# Patient Record
Sex: Female | Born: 1945 | Hispanic: Yes
Health system: Southern US, Community
[De-identification: ages and names within clinical notes are randomized; demographics above are authoritative.]

---

## 2021-04-28 ENCOUNTER — Emergency Department (HOSPITAL_COMMUNITY)
Admission: EM | Admit: 2021-04-28 | Discharge: 2021-04-28 | Disposition: A | Discharge status: Home / Self Care | Payer: Self-pay | Attending: Emergency Medicine | Admitting: Emergency Medicine

## 2021-04-28 DIAGNOSIS — Y93G3 Activity, cooking and baking: Secondary | ICD-10-CM | POA: Insufficient documentation

## 2021-04-28 DIAGNOSIS — Z23 Encounter for immunization: Secondary | ICD-10-CM | POA: Insufficient documentation

## 2021-04-28 DIAGNOSIS — X102XXA Contact with fats and cooking oils, initial encounter: Secondary | ICD-10-CM | POA: Insufficient documentation

## 2021-04-28 DIAGNOSIS — T23201A Burn of second degree of right hand, unspecified site, initial encounter: Secondary | ICD-10-CM | POA: Insufficient documentation

## 2021-04-28 DIAGNOSIS — T23251A Burn of second degree of right palm, initial encounter: Secondary | ICD-10-CM

## 2021-04-28 MED ORDER — SILVER SULFADIAZINE 1 % EX CREA
TOPICAL_CREAM | Freq: Once | CUTANEOUS | Status: AC
  Filled 2021-04-28: qty 50

## 2021-04-28 MED ORDER — ACETAMINOPHEN 500 MG PO TABS
1000.0000 mg | ORAL_TABLET | Freq: Once | ORAL | Status: AC
  Administered 2021-04-28: 1000 mg via ORAL
  Filled 2021-04-28: qty 2

## 2021-04-28 MED ORDER — TETANUS-DIPHTH-ACELL PERTUSSIS 5-2.5-18.5 LF-MCG/0.5 IM SUSY
0.5000 mL | PREFILLED_SYRINGE | Freq: Once | INTRAMUSCULAR | Status: AC
  Administered 2021-04-28: 0.5 mL via INTRAMUSCULAR
  Filled 2021-04-28: qty 0.5

## 2021-04-28 MED ORDER — ACETAMINOPHEN 500 MG PO TABS: 1000.0000 mg | ORAL_TABLET | Freq: Four times a day (QID) | ORAL | 0 refills | Status: AC | PRN

## 2021-04-28 NOTE — Discharge Instructions (Addendum)
You have suffered a burn wound to your right hand.  Please apply Silvadene cream over the wound twice daily for the next 7 days to decrease risk of infection.  Take Tylenol as needed for pain.  Do not pop the blister, allow for it to shrink over time.  Return if you have any concern.  Ha sufrido una quemadura en la mano derecha. Aplique la crema Silvadene sobre la herida dos veces al da durante los prximos 7 das para disminuir el riesgo de infeccin. Tome Tylenol segn sea necesario para Chief Technology Officer. No reviente la ampolla, deje que se encoja con el tiempo. Regrese si tiene alguna inquietud.

## 2021-04-28 NOTE — ED Provider Notes (Signed)
[facility]-EMERGENCY DEPT Provider Note   CSN: [id number] Arrival date & time: 04/28/21  1825     History No chief complaint on file.   Christina Oconnell is a 75 y.o. female.  The history is provided by the patient and a relative. The history is limited by a language barrier. No language interpreter was used.     75 year old female accompanied by son to the ER for evaluation of a burn wound.  Patient is Hispanic, there is a language barrier.  Patient was cooking 4 days ago and accidentally burned her right dominant hand on cooking grease.  Since then it has been swollen and tender oozing of fluid.  Unsure last tetanus status.  She is here for further management.  No report of fever and no report of numbness.  No past medical history on file.  There are no problems to display for this patient.    The histories are not reviewed yet. Please review them in the "History" navigator section and refresh this SmartLink.   OB History   No obstetric history on file.     No family history on file.     Home Medications Prior to Admission medications   Not on File    Allergies    Patient has no allergy information on record.  Review of Systems   Review of Systems  Constitutional: Negative for fever.  Skin: Positive for wound.    Physical Exam Updated Vital Signs BP (!) 173/75   Pulse 66   Temp 99 F (37.2 C) (Oral)   Resp 14   Ht 5\' 2"  (1.575 m)   Wt 72.2 kg   SpO2 100%   BMI 29.12 kg/m   Physical Exam Vitals and nursing note reviewed.  Constitutional:      General: She is not in acute distress.    Appearance: She is well-developed.  HENT:     Head: Atraumatic.  Eyes:     Conjunctiva/sclera: Conjunctivae normal.  Musculoskeletal:        General: Signs of injury (Right hand: Burn mark noted to the thenar eminence with blister formation, sensation intact, no significant erythema.  Area tender to palpation.) present.     Cervical  back: Neck supple.  Skin:    Findings: No rash.  Neurological:     Mental Status: She is alert.     ED Results / Procedures / Treatments   Labs (all labs ordered are listed, but only abnormal results are displayed) Labs Reviewed - No data to display  EKG None  Radiology No results found.  Procedures Procedures   Medications Ordered in ED Medications  Tdap (BOOSTRIX) injection 0.5 mL (has no administration in time range)  silver sulfADIAZINE (SILVADENE) 1 % cream (has no administration in time range)  acetaminophen (TYLENOL) tablet 1,000 mg (has no administration in time range)    ED Course  I have reviewed the triage vital signs and the nursing notes.  Pertinent labs & imaging results that were available during my care of the patient were reviewed by me and considered in my medical decision making (see chart for details).    MDM Rules/Calculators/A&P                          BP (!) 173/75   Pulse 66   Temp 99 F (37.2 C) (Oral)   Resp 14   Ht 5\' 2"  (1.575 m)   Wt 72.2 kg  SpO2 100%   BMI 29.12 kg/m   Final Clinical Impression(s) / ED Diagnoses Final diagnoses:  Partial thickness burn of palm of right hand, initial encounter    Rx / DC Orders ED Discharge Orders         Ordered    acetaminophen (TYLENOL) 500 MG tablet  Every 6 hours PRN        04/28/21 1920         7:19 PM Patient suffered a burn wound consistent with a partial-thickness burn to her right dominant hand.  Burn wound is overlying the thenar eminence.  It is noncircumferential.  Sensation is intact distally, no obvious signs of infection.  It is a blister at this time.  We will apply Silvadene, give Tylenol for pain, and wound care provided.  Return precaution provided.  Will update tetanus.   Fayrene Helper, PA-C 04/28/21 Jarome Matin, MD 04/30/21 [phone]

## 2021-04-28 NOTE — ED Triage Notes (Signed)
Pt c/o burn to right hand, blister noted. Unknown last tetanus

## 2021-04-28 NOTE — ED Notes (Signed)
Cream applied to burn and dressed with nonadherant gauze and gauze roll. Pt and family verbalized understanding of discharge instructions and wound care at home.

## 2021-05-31 ENCOUNTER — Observation Stay (HOSPITAL_COMMUNITY): Payer: Self-pay

## 2021-05-31 ENCOUNTER — Emergency Department (HOSPITAL_COMMUNITY): Payer: Self-pay

## 2021-05-31 ENCOUNTER — Observation Stay (HOSPITAL_COMMUNITY)
Admission: EM | Admit: 2021-05-31 | Discharge: 2021-06-02 | Disposition: A | Discharge status: Home / Self Care | Payer: Self-pay | Attending: Internal Medicine | Admitting: Internal Medicine

## 2021-05-31 DIAGNOSIS — R569 Unspecified convulsions: Principal | ICD-10-CM | POA: Insufficient documentation

## 2021-05-31 DIAGNOSIS — Z20822 Contact with and (suspected) exposure to covid-19: Secondary | ICD-10-CM | POA: Insufficient documentation

## 2021-05-31 DIAGNOSIS — R55 Syncope and collapse: Secondary | ICD-10-CM | POA: Insufficient documentation

## 2021-05-31 DIAGNOSIS — G40909 Epilepsy, unspecified, not intractable, without status epilepticus: Secondary | ICD-10-CM

## 2021-05-31 DIAGNOSIS — R930 Abnormal findings on diagnostic imaging of skull and head, not elsewhere classified: Secondary | ICD-10-CM | POA: Insufficient documentation

## 2021-05-31 DIAGNOSIS — R778 Other specified abnormalities of plasma proteins: Secondary | ICD-10-CM | POA: Insufficient documentation

## 2021-05-31 DIAGNOSIS — I10 Essential (primary) hypertension: Secondary | ICD-10-CM | POA: Insufficient documentation

## 2021-05-31 DIAGNOSIS — Z79899 Other long term (current) drug therapy: Secondary | ICD-10-CM | POA: Insufficient documentation

## 2021-05-31 DIAGNOSIS — M25511 Pain in right shoulder: Secondary | ICD-10-CM | POA: Insufficient documentation

## 2021-05-31 LAB — CBC WITH DIFFERENTIAL/PLATELET
Abs Immature Granulocytes: 0.06 10*3/uL (ref 0.00–0.07)
Basophils Absolute: 0 10*3/uL (ref 0.0–0.1)
Basophils Relative: 1 %
Eosinophils Absolute: 0.1 10*3/uL (ref 0.0–0.5)
Eosinophils Relative: 1 %
HCT: 37.8 % (ref 36.0–46.0)
Hemoglobin: 12 g/dL (ref 12.0–15.0)
Immature Granulocytes: 1 %
Lymphocytes Relative: 28 %
Lymphs Abs: 2.4 10*3/uL (ref 0.7–4.0)
MCH: 26.9 pg (ref 26.0–34.0)
MCHC: 31.7 g/dL (ref 30.0–36.0)
MCV: 84.8 fL (ref 80.0–100.0)
Monocytes Absolute: 0.5 10*3/uL (ref 0.1–1.0)
Monocytes Relative: 6 %
Neutro Abs: 5.3 10*3/uL (ref 1.7–7.7)
Neutrophils Relative %: 63 %
Platelets: 311 10*3/uL (ref 150–400)
RBC: 4.46 MIL/uL (ref 3.87–5.11)
RDW: 14.2 % (ref 11.5–15.5)
WBC: 8.3 10*3/uL (ref 4.0–10.5)
nRBC: 0 % (ref 0.0–0.2)

## 2021-05-31 LAB — COMPREHENSIVE METABOLIC PANEL
ALT: 20 U/L (ref 0–44)
AST: 29 U/L (ref 15–41)
Albumin: 3.7 g/dL (ref 3.5–5.0)
Alkaline Phosphatase: 82 U/L (ref 38–126)
Anion gap: 10 (ref 5–15)
BUN: 17 mg/dL (ref 8–23)
CO2: 23 mmol/L (ref 22–32)
Calcium: 8.8 mg/dL — ABNORMAL LOW (ref 8.9–10.3)
Chloride: 102 mmol/L (ref 98–111)
Creatinine, Ser: 0.71 mg/dL (ref 0.44–1.00)
GFR, Estimated: >60 mL/min (ref >60)
Glucose, Bld: 140 mg/dL — ABNORMAL HIGH (ref 70–99)
Potassium: 4 mmol/L (ref 3.5–5.1)
Sodium: 135 mmol/L (ref 135–145)
Total Bilirubin: 0.5 mg/dL (ref 0.3–1.2)
Total Protein: 6.8 g/dL (ref 6.5–8.1)

## 2021-05-31 LAB — TROPONIN I (HIGH SENSITIVITY)
Troponin I (High Sensitivity): 8 ng/L (ref <18)
Troponin I (High Sensitivity): 22 ng/L — ABNORMAL HIGH (ref <18)

## 2021-05-31 LAB — URINALYSIS, ROUTINE W REFLEX MICROSCOPIC
Bacteria, UA: NONE SEEN
Bilirubin Urine: NEGATIVE
Glucose, UA: NEGATIVE mg/dL
Ketones, ur: NEGATIVE mg/dL
Leukocytes,Ua: NEGATIVE
Nitrite: NEGATIVE
Protein, ur: NEGATIVE mg/dL
Specific Gravity, Urine: 1.008 (ref 1.005–1.030)
pH: 7 (ref 5.0–8.0)

## 2021-05-31 LAB — PROTIME-INR
INR: 1 (ref 0.8–1.2)
Prothrombin Time: 13.6 seconds (ref 11.4–15.2)

## 2021-05-31 LAB — RESP PANEL BY RT-PCR (FLU A&B, COVID) ARPGX2
Influenza A by PCR: NEGATIVE
Influenza B by PCR: NEGATIVE
SARS Coronavirus 2 by RT PCR: NEGATIVE

## 2021-05-31 LAB — CBG MONITORING, ED: Glucose-Capillary: 117 mg/dL — ABNORMAL HIGH (ref 70–99)

## 2021-05-31 MED ORDER — HYDRALAZINE HCL 20 MG/ML IJ SOLN: 5.0000 mg | Freq: Four times a day (QID) | INTRAMUSCULAR | Status: DC | PRN

## 2021-05-31 MED ORDER — VITAMIN D 25 MCG (1000 UNIT) PO TABS
1000.0000 [IU] | ORAL_TABLET | Freq: Every day | ORAL | Status: DC
  Administered 2021-06-01 – 2021-06-02 (×2): 1000 [IU] via ORAL
  Filled 2021-05-31 (×2): qty 1

## 2021-05-31 MED ORDER — MELATONIN 3 MG PO TABS: 3.0000 mg | ORAL_TABLET | Freq: Every evening | ORAL | Status: DC | PRN

## 2021-05-31 MED ORDER — LORAZEPAM 2 MG/ML IJ SOLN: 2.0000 mg | Freq: Four times a day (QID) | INTRAMUSCULAR | Status: DC | PRN

## 2021-05-31 MED ORDER — OMEGA-3-ACID ETHYL ESTERS 1 G PO CAPS
1.0000 g | ORAL_CAPSULE | Freq: Two times a day (BID) | ORAL | Status: DC
  Administered 2021-05-31 – 2021-06-02 (×4): 1 g via ORAL
  Filled 2021-05-31 (×4): qty 1

## 2021-05-31 MED ORDER — VITAMIN B-12 1000 MCG PO TABS
500.0000 ug | ORAL_TABLET | Freq: Every day | ORAL | Status: DC
  Administered 2021-05-31 – 2021-06-02 (×3): 500 ug via ORAL
  Filled 2021-05-31 (×3): qty 1

## 2021-05-31 MED ORDER — POLYETHYLENE GLYCOL 3350 17 G PO PACK: 17.0000 g | PACK | Freq: Every day | ORAL | Status: DC | PRN

## 2021-05-31 MED ORDER — CARBAMAZEPINE 200 MG PO TABS
200.0000 mg | ORAL_TABLET | Freq: Once | ORAL | Status: AC
  Administered 2021-05-31: 200 mg via ORAL
  Filled 2021-05-31: qty 1

## 2021-05-31 MED ORDER — GADOBUTROL 1 MMOL/ML IV SOLN
6.5000 mL | Freq: Once | INTRAVENOUS | Status: AC | PRN
  Administered 2021-05-31: 6.5 mL via INTRAVENOUS

## 2021-05-31 MED ORDER — OXYCODONE HCL 5 MG PO TABS
5.0000 mg | ORAL_TABLET | Freq: Four times a day (QID) | ORAL | Status: DC | PRN
Start: 2021-05-31 — End: 2021-06-02
  Administered 2021-05-31: 5 mg via ORAL
  Filled 2021-05-31: qty 1

## 2021-05-31 MED ORDER — ACETAMINOPHEN 325 MG PO TABS
650.0000 mg | ORAL_TABLET | Freq: Four times a day (QID) | ORAL | Status: DC | PRN
  Administered 2021-06-01: 650 mg via ORAL
  Filled 2021-05-31: qty 2

## 2021-05-31 MED ORDER — ONDANSETRON HCL 4 MG/2ML IJ SOLN: 4.0000 mg | Freq: Four times a day (QID) | INTRAMUSCULAR | Status: DC | PRN

## 2021-05-31 MED ORDER — CARBAMAZEPINE 200 MG PO TABS
200.0000 mg | ORAL_TABLET | Freq: Two times a day (BID) | ORAL | Status: DC
  Administered 2021-05-31 – 2021-06-02 (×4): 200 mg via ORAL
  Filled 2021-05-31 (×5): qty 1

## 2021-05-31 MED ORDER — ENOXAPARIN SODIUM 40 MG/0.4ML IJ SOSY
40.0000 mg | PREFILLED_SYRINGE | INTRAMUSCULAR | Status: DC
  Administered 2021-05-31 – 2021-06-01 (×2): 40 mg via SUBCUTANEOUS
  Filled 2021-05-31 (×2): qty 0.4

## 2021-05-31 MED ORDER — LACTATED RINGERS IV SOLN: INTRAVENOUS | Status: AC

## 2021-05-31 MED ORDER — ENALAPRIL MALEATE 5 MG PO TABS
20.0000 mg | ORAL_TABLET | Freq: Every day | ORAL | Status: DC
  Administered 2021-05-31 – 2021-06-02 (×3): 20 mg via ORAL
  Filled 2021-05-31 (×3): qty 4
  Filled 2021-05-31: qty 1

## 2021-05-31 NOTE — ED Triage Notes (Addendum)
Pt to ED via EMS from [place] C/O seizure like activity witnessed by family. lasting aprox 5 mins..Incontinent episode after,  No memory of event. #20 LFA; No medications given by EMS. Limited history /report . Pt takes tegretol, been off 3 days. Medical hx: HTN . Last VS: 160/92, CNG 130, hr 90, 98%RA. RR 18

## 2021-05-31 NOTE — H&P (Addendum)
History and Physical  Christina Oconnell TKW:[id number] DOB: 16-Jan-1946 DOA: 05/31/2021  Referring physician: Dr. Rodena Medin, EDP. PCP: Pcp, No  Outpatient Specialists: Neurology in [place] Patient coming from: Home, visiting family in [place] from [place].    Chief Complaint: Passed out while at [employer] during her meal.  HPI: Christina Oconnell is a 75 y.o. female with medical history significant for essential hypertension, absence seizure disorder who presented to [facility] ED via EMS after passing out while at [employer] [employer] [facility] during her meal.  Patient ran out of her carbamazepine 200 mg twice daily, 4 days ago.  She is visiting family in [place] from [place].  During the meal at [employer] she collapsed falling to her right side.  She became unresponsive for approximately 5 minutes.  She was in her usual state of health prior to this.  This event was associated with urine incontinence.  However her absence seizure has not previously presented with syncope.  History is mainly obtained from her son at bedside.  She was brought to the ED for further evaluation.  CT head in the ED revealed asymmetric hypodensity left cerebellum with radiology recommendation for brain MRI with and without contrast.  [facility], hospitalist team, was asked to admit.    She was seen and examined with her son at her bedside.  Not postictal at the time of this visit.  Interview completed using translator Donata Clay [phone].  Patient reports no prior history of syncope.  No dizziness, lightheadedness, palpitations, chest pain or dyspnea prior to passing out.  No history of use of alcohol.  No family history of seizures.  Patient personally requests neurology consultation.  Dr. Thomasena Edis, neurology, consulted.  ED Course:  Temperature 98.2.  BP 145/80, pulse 72, respiration rate 16, O2 saturation 97% on room air.  Lab studies remarkable for serum sodium 135, potassium 4.0, serum bicarb 23, glucose 140, BUN 17, creatinine 0.71,  anion gap 10, GFR greater than 60, troponin 8, 22.  CBC with differential essentially unremarkable.  Urine analysis and COVID-19 screening test are pending.  Review of Systems: Review of systems as noted in the HPI. All other systems reviewed and are negative.  Past medical history: Seizure disorder, absence seizure. Essential hypertension  Social History:  has no history on file for tobacco use, alcohol use, and drug use.   No Known Allergies  Family history: No family history of CVA. Brother with history of hypertension.  Prior to Admission medications   Medication Sig Start Date End Date Taking? Authorizing Provider  acetaminophen (TYLENOL) 500 MG tablet Take 2 tablets (1,000 mg total) by mouth every 6 (six) hours as needed for moderate pain. 04/28/21  Yes Fayrene Helper, PA-C  carbamazepine (TEGRETOL) 200 MG tablet Take 200 mg by mouth in the morning and at bedtime.   Yes [provider]  cholecalciferol (VITAMIN D3) 25 MCG (1000 UNIT) tablet Take 1,000 Units by mouth daily.   Yes [provider]  Cyanocobalamin (VITAMIN B 12 PO) Take 1 tablet by mouth daily.   Yes [provider]  enalapril (VASOTEC) 20 MG tablet Take 20 mg by mouth daily.   Yes [provider]  omega-3 acid ethyl esters (LOVAZA) 1 g capsule Take 1 g by mouth 2 (two) times daily.   Yes [provider]    Physical Exam: BP (!) 145/80   Pulse 72   Temp 98.2 F (36.8 C) (Oral)   Resp 16   SpO2 97%   General: 75 y.o. year-old female  well developed well nourished in no acute distress.  Alert and oriented x3. Cardiovascular: Regular rate and rhythm with no rubs or gallops.  No thyromegaly or JVD noted.  No lower extremity edema. 2/4 pulses in all 4 extremities. Respiratory: Clear to auscultation with no wheezes or rales. Good inspiratory effort. Abdomen: Soft nontender nondistended with normal bowel sounds x4 quadrants. Muskuloskeletal: No cyanosis, clubbing or edema  noted bilaterally Neuro: CN II-XII intact, strength, sensation, reflexes Skin: No ulcerative lesions noted or rashes Psychiatry: Judgement and insight appear normal. Mood is appropriate for condition and setting          Labs on Admission:  Basic Metabolic Panel: Recent Labs  Lab 05/31/21 1542  NA 135  K 4.0  CL 102  CO2 23  GLUCOSE 140*  BUN 17  CREATININE 0.71  CALCIUM 8.8*   Liver Function Tests: Recent Labs  Lab 05/31/21 1542  AST 29  ALT 20  ALKPHOS 82  BILITOT 0.5  PROT 6.8  ALBUMIN 3.7   No results for input(s): LIPASE, AMYLASE in the last 168 hours. No results for input(s): AMMONIA in the last 168 hours. CBC: Recent Labs  Lab 05/31/21 1542  WBC 8.3  NEUTROABS 5.3  HGB 12.0  HCT 37.8  MCV 84.8  PLT 311   Cardiac Enzymes: No results for input(s): CKTOTAL, CKMB, CKMBINDEX, TROPONINI in the last 168 hours.  BNP (last 3 results) No results for input(s): BNP in the last 8760 hours.  ProBNP (last 3 results) No results for input(s): PROBNP in the last 8760 hours.  CBG: Recent Labs  Lab 05/31/21 1522  GLUCAP 117*    Radiological Exams on Admission: DG Shoulder Right  Result Date: 05/31/2021 CLINICAL DATA:  Right shoulder pain after seizure EXAM: RIGHT SHOULDER - 2+ VIEW COMPARISON:  None. FINDINGS: There is no evidence of fracture or dislocation. There is no evidence of arthropathy or other focal bone abnormality. Soft tissues are unremarkable. IMPRESSION: Negative. Electronically Signed   By: Duanne Guess D.O.   On: 05/31/2021 16:29   CT Head Wo Contrast  Result Date: 05/31/2021 CLINICAL DATA:  Syncope.  Seizure. EXAM: CT HEAD WITHOUT CONTRAST TECHNIQUE: Contiguous axial images were obtained from the base of the skull through the vertex without intravenous contrast. COMPARISON:  None. FINDINGS: Brain: Asymmetric hypodensity left cerebellum. Ventricle size normal.  Negative for hemorrhage. Vascular: Negative for hyperdense vessel Skull: Negative  Sinuses/Orbits: Mild mucosal edema paranasal sinuses. Negative orbit Other: None IMPRESSION: Asymmetric hypodensity left cerebellum. While this could represent artifact, infarct and tumor possible. Recommend MRI brain without and with contrast for further evaluation. Electronically Signed   By: Marlan Palau M.D.   On: 05/31/2021 16:38   DG Chest Port 1 View  Result Date: 05/31/2021 CLINICAL DATA:  Syncope.  Seizure EXAM: PORTABLE CHEST 1 VIEW COMPARISON:  None. FINDINGS: The heart size and mediastinal contours are within normal limits. Both lungs are clear. The visualized skeletal structures are unremarkable. IMPRESSION: No active disease. Electronically Signed   By: Marlan Palau M.D.   On: 05/31/2021 16:26    EKG: I independently viewed the EKG done and my findings are as followed: Sinus rhythm rate of 81.  Nonspecific ST-T changes.  QTc 487.  Assessment/Plan Present on Admission: **None**  Active Problems:   Seizure (HCC)  Syncope versus seizure Reports of losing consciousness, lasting 5 minutes associated with urinary incontinence Unclear if syncope versus seizure activity Obtain EEG Orthostatic vital signs 2D echo Seizure precautions/fall precautions Gentle IV fluid  hydration LR 50 cc/h x 1 day  Seizure disorder with medication nonadherence Ran out of her carbamazepine x4 days Obtain carbamazepine level Resume home carbamazepine 200 mg twice daily IV Ativan as needed for breakthrough seizures. Seizure precautions  Abnormal CT head CT head in the ED revealed asymmetric hypodensity left cerebellum with radiology recommendation for brain MRI with and without contrast.   Follow MRI brain, normal brain MRI.  Elevated troponin suspect demand ischemia Troponin 22 from 8 Repeat troponin No evidence of acute ischemia on 12 lead EKG Follow 2D echo  Essential hypertension BP is currently not at goal, elevated. Resume home oral antihypertensives IV antihypertensives as needed  with parameters. Monitor vital signs  Right shoulder pain post fall Analgesics as needed   DVT prophylaxis: SCDs, subcu Lovenox daily.  Code Status: Full code.  Family Communication: Son in the room.  Disposition Plan: Admit to telemetry medical.  Consults called: Neurology Dr. Thomasena Edis.  Admission status: Observation status.   Status is: Observation    Dispo:  Patient From: Home  Planned Disposition: Home possibly on 06/01/2021.  Medically stable for discharge: No      [name] MD [facility] Pager [phone]  If 7PM-7AM, please contact night-coverage [email or web address] Password [email or web address]  05/31/2021, 8:10 PM

## 2021-05-31 NOTE — Progress Notes (Signed)
Pt admitted from ED with c/o of syncope, pt alert and oriented, c/o of pain in the right arm and leg, settled in bed with call light within pt's reach, Tele monitor put and verified on pt, Tele (stratus) interpreter used to assess pt, all questions answered accordingly via the interpreter, safety concern explained and initiated as well, pt was however reassured and will continue to monitor. Obasogie-Asidi, Ellenore Roscoe Efe

## 2021-05-31 NOTE — ED Provider Notes (Signed)
[facility] EMERGENCY DEPARTMENT Provider Note   CSN: [id number] Arrival date & time: 05/31/21  1515     History Chief Complaint  Patient presents with   Seizures    Christina Oconnell is a 75 y.o. female.  75 year old female with prior medical history as detailed below presents for evaluation.  Patient arrives by EMS.  Patient is Spanish-speaking only.  She is visiting here from [place].  She is due to return home at the end of the month.  Interpreter used for history taking.  History obtained from both the patient and the patient's son.  Patient apparently was at [facility] with family.  During the meal she collapsed.  She will became unresponsive.  She was unresponsive for approximately 5 minutes per her son who was at bedside.  She had no witnessed seizure-like activity.  Patient with a prior history of apparent absence seizure's.  She is followed by neurology in [place].  She is prescribed carbamazepine 200 mg twice daily.  She reports that she has not taken the carbamazepine in the last 4 days.  She was running short given her lengthy visit here to the [place].  Her prior episodes of absence seizure do not result in syncope per report.  Her last MRI and EEG with her neurologist and visit was approximately 1-1/2 to 2 years prior.  She reports that her work-up previously in [place] was without evidence of significant anatomic or EEG abnormality.  She denies any significant complaint prior to the witnessed syncopal event today.  Her son reports that when she passed out she fell sideways and landed hard on her right shoulder.  She complains of mild pain to the right shoulder.  She denies chest pain or shortness of breath.  She denies nausea or vomiting.  She denies recent fever or illness.  Her son reports that the patient did not stop breathing.  She did not have emesis.  She was apparently incontinent of urine after the event.  The history is  provided by the patient, medical records and a relative. A language interpreter was used.  Loss of Consciousness Episode history:  Single Most recent episode:  Today Duration:  5 minutes Timing:  Rare Progression:  Resolved Chronicity:  New Witnessed: yes   Relieved by:  Nothing Worsened by:  Nothing     No past medical history on file.  There are no problems to display for this patient.    OB History   No obstetric history on file.     No family history on file.     Home Medications Prior to Admission medications   Medication Sig Start Date End Date Taking? Authorizing Provider  acetaminophen (TYLENOL) 500 MG tablet Take 2 tablets (1,000 mg total) by mouth every 6 (six) hours as needed for moderate pain. 04/28/21   Fayrene Helper, PA-C    Allergies    Patient has no known allergies.  Review of Systems   Review of Systems  Cardiovascular:  Positive for syncope.  All other systems reviewed and are negative.  Physical Exam Updated Vital Signs BP (!) 177/79   Pulse 73   Temp 98.2 F (36.8 C) (Oral)   Resp (!) 21   SpO2 97%   Physical Exam Vitals and nursing note reviewed.  Constitutional:      General: She is not in acute distress.    Appearance: Normal appearance. She is well-developed.  HENT:     Head: Normocephalic and atraumatic.  Eyes:  Conjunctiva/sclera: Conjunctivae normal.     Pupils: Pupils are equal, round, and reactive to light.  Cardiovascular:     Rate and Rhythm: Normal rate and regular rhythm.     Heart sounds: Normal heart sounds.  Pulmonary:     Effort: Pulmonary effort is normal. No respiratory distress.     Breath sounds: Normal breath sounds.  Abdominal:     General: There is no distension.     Palpations: Abdomen is soft.     Tenderness: There is no abdominal tenderness.  Musculoskeletal:        General: Tenderness present. No deformity. Normal range of motion.     Cervical back: Normal range of motion and neck supple.      Comments: Mild diffuse tenderness noted to the lateral aspect of the right shoulder.  Full passive range of movement noted of the right shoulder.  Distal right upper extremity is neurovascular intact.  Skin:    General: Skin is warm and dry.  Neurological:     General: No focal deficit present.     Mental Status: She is alert and oriented to person, place, and time. Mental status is at baseline.     Cranial Nerves: No cranial nerve deficit.     Sensory: No sensory deficit.     Motor: No weakness.     Coordination: Coordination normal.    ED Results / Procedures / Treatments   Labs (all labs ordered are listed, but only abnormal results are displayed) Labs Reviewed  COMPREHENSIVE METABOLIC PANEL - Abnormal; Notable for the following components:      Result Value   Glucose, Bld 140 (*)    Calcium 8.8 (*)    All other components within normal limits  CBG MONITORING, ED - Abnormal; Notable for the following components:   Glucose-Capillary 117 (*)    All other components within normal limits  RESP PANEL BY RT-PCR (FLU A&B, COVID) ARPGX2  CBC WITH DIFFERENTIAL/PLATELET  PROTIME-INR  URINALYSIS, ROUTINE W REFLEX MICROSCOPIC  TROPONIN I (HIGH SENSITIVITY)  TROPONIN I (HIGH SENSITIVITY)    EKG EKG Interpretation  Date/Time:  Saturday May 31 2021 15:23:27 EDT Ventricular Rate:  81 PR Interval:  144 QRS Duration: 85 QT Interval:  419 QTC Calculation: 487 R Axis:   -10 Text Interpretation: Sinus rhythm Abnormal R-wave progression, early transition LVH with secondary repolarization abnormality Borderline prolonged QT interval Confirmed by Kristine Royal [id number]) on 05/31/2021 4:39:19 PM  Radiology DG Shoulder Right  Result Date: 05/31/2021 CLINICAL DATA:  Right shoulder pain after seizure EXAM: RIGHT SHOULDER - 2+ VIEW COMPARISON:  None. FINDINGS: There is no evidence of fracture or dislocation. There is no evidence of arthropathy or other focal bone abnormality. Soft tissues are  unremarkable. IMPRESSION: Negative. Electronically Signed   By: Duanne Guess D.O.   On: 05/31/2021 16:29   CT Head Wo Contrast  Result Date: 05/31/2021 CLINICAL DATA:  Syncope.  Seizure. EXAM: CT HEAD WITHOUT CONTRAST TECHNIQUE: Contiguous axial images were obtained from the base of the skull through the vertex without intravenous contrast. COMPARISON:  None. FINDINGS: Brain: Asymmetric hypodensity left cerebellum. Ventricle size normal.  Negative for hemorrhage. Vascular: Negative for hyperdense vessel Skull: Negative Sinuses/Orbits: Mild mucosal edema paranasal sinuses. Negative orbit Other: None IMPRESSION: Asymmetric hypodensity left cerebellum. While this could represent artifact, infarct and tumor possible. Recommend MRI brain without and with contrast for further evaluation. Electronically Signed   By: Marlan Palau M.D.   On: 05/31/2021 16:38   DG Chest  Port 1 View  Result Date: 05/31/2021 CLINICAL DATA:  Syncope.  Seizure EXAM: PORTABLE CHEST 1 VIEW COMPARISON:  None. FINDINGS: The heart size and mediastinal contours are within normal limits. Both lungs are clear. The visualized skeletal structures are unremarkable. IMPRESSION: No active disease. Electronically Signed   By: Marlan Palau M.D.   On: 05/31/2021 16:26    Procedures Procedures   Medications Ordered in ED Medications  carbamazepine (TEGRETOL) tablet 200 mg (has no administration in time range)    ED Course  I have reviewed the triage vital signs and the nursing notes.  Pertinent labs & imaging results that were available during my care of the patient were reviewed by me and considered in my medical decision making (see chart for details).    MDM Rules/Calculators/A&P                          MDM  MSE complete  Tamia Dial de Revello was evaluated in Emergency Department on 05/31/2021 for the symptoms described in the history of present illness. She was evaluated in the context of the global COVID-19  pandemic, which necessitated consideration that the patient might be at risk for infection with the SARS-CoV-2 virus that causes COVID-19. Institutional protocols and algorithms that pertain to the evaluation of patients at risk for COVID-19 are in a state of rapid change based on information released by regulatory bodies including the CDC and federal and state organizations. These policies and algorithms were followed during the patient's care in the ED.   Patient is presenting for evaluation following reported and witnessed syncopal event.  Patient had witnessed syncope.  She was unresponsive for approximately 5 minutes.  Patient does have a prior history of apparent Seizures.  She is apparently out of her regularly prescribed carbamazepine.  Described episode today is not consistent with prior history of absence seizure's.  Initial work-up is without significant abnormality.  However, given patient's reported syncope and lack of local primary care will plan for observation overnight.   Final Clinical Impression(s) / ED Diagnoses Final diagnoses:  Syncope, unspecified syncope type    Rx / DC Orders ED Discharge Orders     None        Wynetta Fines, MD 05/31/21 1950

## 2021-05-31 NOTE — ED Notes (Signed)
Pt transported to CT ?

## 2021-06-01 DIAGNOSIS — I10 Essential (primary) hypertension: Secondary | ICD-10-CM

## 2021-06-01 DIAGNOSIS — R930 Abnormal findings on diagnostic imaging of skull and head, not elsewhere classified: Secondary | ICD-10-CM

## 2021-06-01 DIAGNOSIS — M25511 Pain in right shoulder: Secondary | ICD-10-CM

## 2021-06-01 DIAGNOSIS — R7989 Other specified abnormal findings of blood chemistry: Secondary | ICD-10-CM

## 2021-06-01 DIAGNOSIS — R778 Other specified abnormalities of plasma proteins: Secondary | ICD-10-CM

## 2021-06-01 LAB — PHOSPHORUS: Phosphorus: 3.3 mg/dL (ref 2.5–4.6)

## 2021-06-01 LAB — GLUCOSE, CAPILLARY
Glucose-Capillary: 100 mg/dL — ABNORMAL HIGH (ref 70–99)
Glucose-Capillary: 136 mg/dL — ABNORMAL HIGH (ref 70–99)
Glucose-Capillary: 96 mg/dL (ref 70–99)

## 2021-06-01 LAB — CBC
HCT: 33.8 % — ABNORMAL LOW (ref 36.0–46.0)
Hemoglobin: 10.8 g/dL — ABNORMAL LOW (ref 12.0–15.0)
MCH: 26.9 pg (ref 26.0–34.0)
MCHC: 32 g/dL (ref 30.0–36.0)
MCV: 84.1 fL (ref 80.0–100.0)
Platelets: 291 10*3/uL (ref 150–400)
RBC: 4.02 MIL/uL (ref 3.87–5.11)
RDW: 14.3 % (ref 11.5–15.5)
WBC: 10.1 10*3/uL (ref 4.0–10.5)
nRBC: 0 % (ref 0.0–0.2)

## 2021-06-01 LAB — BASIC METABOLIC PANEL
Anion gap: 5 (ref 5–15)
BUN: 15 mg/dL (ref 8–23)
CO2: 28 mmol/L (ref 22–32)
Calcium: 8.9 mg/dL (ref 8.9–10.3)
Chloride: 105 mmol/L (ref 98–111)
Creatinine, Ser: 0.67 mg/dL (ref 0.44–1.00)
GFR, Estimated: >60 mL/min (ref >60)
Glucose, Bld: 153 mg/dL — ABNORMAL HIGH (ref 70–99)
Potassium: 3.5 mmol/L (ref 3.5–5.1)
Sodium: 138 mmol/L (ref 135–145)

## 2021-06-01 LAB — MAGNESIUM: Magnesium: 2 mg/dL (ref 1.7–2.4)

## 2021-06-01 LAB — TROPONIN I (HIGH SENSITIVITY): Troponin I (High Sensitivity): 34 ng/L — ABNORMAL HIGH (ref <18)

## 2021-06-01 MED ORDER — TRAMADOL HCL 50 MG PO TABS: 50.0000 mg | ORAL_TABLET | Freq: Four times a day (QID) | ORAL | Status: DC | PRN

## 2021-06-01 MED ORDER — INSULIN ASPART 100 UNIT/ML IJ SOLN: 0.0000 [IU] | Freq: Three times a day (TID) | INTRAMUSCULAR | Status: DC

## 2021-06-01 MED ORDER — INSULIN ASPART 100 UNIT/ML IJ SOLN: 0.0000 [IU] | Freq: Every day | INTRAMUSCULAR | Status: DC

## 2021-06-01 NOTE — Progress Notes (Signed)
Orthopedic Tech Progress Note Patient Details:  Christina Oconnell 1946-06-05 [id number]   Ortho Devices Type of Ortho Device: Sling immobilizer Ortho Device/Splint Location: RUE Ortho Device/Splint Interventions: Adjustment, Application, Ordered   Post Interventions Patient Tolerated: Well Instructions Provided: Care of device  Vaughn Beaumier Carmine Savoy 06/01/2021, 9:44 AM

## 2021-06-01 NOTE — Progress Notes (Signed)
[facility] PROGRESS NOTE    Progress Note  Christina Oconnell  IWP:[id number] DOB: Jan 22, 1946 DOA: 05/31/2021 PCP: Pcp, No     Brief Narrative:   Christina Oconnell is an 75 y.o. female past medical history significant for essential hypertension absence seizure's presents with oxycodone ED after passing out at [facility] during his meal.  He relates he ran out of his carbamazepine about 4 days ago.  She has urine incontinence.   Assessment/Plan:   Seizure disorder [facility]): Unconscious lasting 5 minutes with urinary incontinence.  Likely seizures. Seizure precaution she was started empirically on her carbamazepine. Needed as needed for breakthrough seizures. EEG is pending.  Abnormal CT of the head CT of the head revealed asymmetric hyperdensity of the right cerebellum. MRI unremarkable.  Elevated troponin: Likely demand ischemia in the setting of seizures. Twelve-lead EKG showed no signs of ischemia.  Essential hypertension: Blood pressure is elevated she was resumed on her home oral hypertensive medication.     Right shoulder pain: X-ray of the shoulder showed no fracture. Continue Tylenol and tramadol for pain. Consult physical therapy.  DVT prophylaxis: lovenox Family Communication:Son Status is: Observation  The patient remains OBS appropriate and will d/c before 2 midnights.  Dispo:  Patient From: Home  Planned Disposition: Home  Medically stable for discharge: No       Code Status:     Code Status Orders  (From admission, onward)           Start     Ordered   05/31/21 1951  Full code  Continuous        05/31/21 1951           Code Status History     Date Active Date Inactive Code Status Order ID Comments User Context   05/31/2021 1950 05/31/2021 1951 Full Code [id number]  [name], DO ED         IV Access:   Peripheral IV   Procedures and diagnostic studies:   DG Shoulder Right  Result Date:  05/31/2021 CLINICAL DATA:  Right shoulder pain after seizure EXAM: RIGHT SHOULDER - 2+ VIEW COMPARISON:  None. FINDINGS: There is no evidence of fracture or dislocation. There is no evidence of arthropathy or other focal bone abnormality. Soft tissues are unremarkable. IMPRESSION: Negative. Electronically Signed   By: Duanne Guess D.O.   On: 05/31/2021 16:29   CT Head Wo Contrast  Result Date: 05/31/2021 CLINICAL DATA:  Syncope.  Seizure. EXAM: CT HEAD WITHOUT CONTRAST TECHNIQUE: Contiguous axial images were obtained from the base of the skull through the vertex without intravenous contrast. COMPARISON:  None. FINDINGS: Brain: Asymmetric hypodensity left cerebellum. Ventricle size normal.  Negative for hemorrhage. Vascular: Negative for hyperdense vessel Skull: Negative Sinuses/Orbits: Mild mucosal edema paranasal sinuses. Negative orbit Other: None IMPRESSION: Asymmetric hypodensity left cerebellum. While this could represent artifact, infarct and tumor possible. Recommend MRI brain without and with contrast for further evaluation. Electronically Signed   By: Marlan Palau M.D.   On: 05/31/2021 16:38   MR Brain W and Wo Contrast  Result Date: 05/31/2021 CLINICAL DATA:  Seizure EXAM: MRI HEAD WITHOUT AND WITH CONTRAST TECHNIQUE: Multiplanar, multiecho pulse sequences of the brain and surrounding structures were obtained without and with intravenous contrast. CONTRAST:  6.33mL GADAVIST GADOBUTROL 1 MMOL/ML IV SOLN COMPARISON:  None. FINDINGS: Brain: No acute infarct, mass effect or extra-axial collection. No acute or chronic hemorrhage. Normal white matter signal, parenchymal volume and CSF spaces. The midline  structures are normal. The hippocampi are normal and symmetric in size and signal. The hypothalamus and mamillary bodies are normal. There is no cortical ectopia or dysplasia. No abnormal contrast enhancement. Vascular: Major flow voids are preserved. Skull and upper cervical spine: Normal calvarium  and skull base. Visualized upper cervical spine and soft tissues are normal. Sinuses/Orbits:No paranasal sinus fluid levels or advanced mucosal thickening. No mastoid or middle ear effusion. Normal orbits. IMPRESSION: Normal brain MRI. Electronically Signed   By: Deatra Robinson M.D.   On: 05/31/2021 20:36   DG Chest Port 1 View  Result Date: 05/31/2021 CLINICAL DATA:  Syncope.  Seizure EXAM: PORTABLE CHEST 1 VIEW COMPARISON:  None. FINDINGS: The heart size and mediastinal contours are within normal limits. Both lungs are clear. The visualized skeletal structures are unremarkable. IMPRESSION: No active disease. Electronically Signed   By: Marlan Palau M.D.   On: 05/31/2021 16:26     Medical Consultants:   None.   Subjective:    Christina Oconnell relates right shoulder pain  Objective:    Vitals:   05/31/21 2308 06/01/21 0202 06/01/21 0400 06/01/21 0419  BP: (!) 197/86 (!) 93/54 101/61   Pulse:  67 61   Resp:  20 19   Temp: 98.2 F (36.8 C)   98.7 F (37.1 C)  TempSrc: Oral   Oral  SpO2:  96% 95%   Weight:       SpO2: 95 %   Intake/Output Summary (Last 24 hours) at 06/01/2021 0659 Last data filed at 06/01/2021 0400 Gross per 24 hour  Intake 214.15 ml  Output 700 ml  Net -485.85 ml   Filed Weights   05/31/21 2300  Weight: 72.5 kg    Exam: General exam: In no acute distress. Respiratory system: Good air movement and clear to auscultation. Cardiovascular system: S1 & S2 heard, RRR. No JVD. Gastrointestinal system: Abdomen is nondistended, soft and nontender.  Extremities: No pedal edema. Skin: No rashes, lesions or ulcers Psychiatry: Judgement and insight appear normal. Mood & affect appropriate.    Data Reviewed:    Labs: Basic Metabolic Panel: Recent Labs  Lab 05/31/21 1542 06/01/21 0215  NA 135 138  K 4.0 3.5  CL 102 105  CO2 23 28  GLUCOSE 140* 153*  BUN 17 15  CREATININE 0.71 0.67  CALCIUM 8.8* 8.9  MG  --  2.0  PHOS  --  3.3    GFR Estimated Creatinine Clearance: 57.6 mL/min (by C-G formula based on SCr of 0.67 mg/dL). Liver Function Tests: Recent Labs  Lab 05/31/21 1542  AST 29  ALT 20  ALKPHOS 82  BILITOT 0.5  PROT 6.8  ALBUMIN 3.7   No results for input(s): LIPASE, AMYLASE in the last 168 hours. No results for input(s): AMMONIA in the last 168 hours. Coagulation profile Recent Labs  Lab 05/31/21 1542  INR 1.0   COVID-19 Labs  No results for input(s): DDIMER, FERRITIN, LDH, CRP in the last 72 hours.  Lab Results  Component Value Date   SARSCOV2NAA NEGATIVE 05/31/2021    CBC: Recent Labs  Lab 05/31/21 1542 06/01/21 0215  WBC 8.3 10.1  NEUTROABS 5.3  --   HGB 12.0 10.8*  HCT 37.8 33.8*  MCV 84.8 84.1  PLT 311 291   Cardiac Enzymes: No results for input(s): CKTOTAL, CKMB, CKMBINDEX, TROPONINI in the last 168 hours. BNP (last 3 results) No results for input(s): PROBNP in the last 8760 hours. CBG: Recent Labs  Lab 05/31/21 1522  06/01/21 0636  GLUCAP 117* 100*   D-Dimer: No results for input(s): DDIMER in the last 72 hours. Hgb A1c: No results for input(s): HGBA1C in the last 72 hours. Lipid Profile: No results for input(s): CHOL, HDL, LDLCALC, TRIG, CHOLHDL, LDLDIRECT in the last 72 hours. Thyroid function studies: No results for input(s): TSH, T4TOTAL, T3FREE, THYROIDAB in the last 72 hours.  Invalid input(s): FREET3 Anemia work up: No results for input(s): VITAMINB12, FOLATE, FERRITIN, TIBC, IRON, RETICCTPCT in the last 72 hours. Sepsis Labs: Recent Labs  Lab 05/31/21 1542 06/01/21 0215  WBC 8.3 10.1   Microbiology Recent Results (from the past 240 hour(s))  Resp Panel by RT-PCR (Flu A&B, Covid) Nasopharyngeal Swab     Status: None   Collection Time: 05/31/21  5:56 PM   Specimen: Nasopharyngeal Swab; Nasopharyngeal(NP) swabs in vial transport medium  Result Value Ref Range Status   SARS Coronavirus 2 by RT PCR NEGATIVE NEGATIVE Final    Comment:  (NOTE) SARS-CoV-2 target nucleic acids are NOT DETECTED.  The SARS-CoV-2 RNA is generally detectable in upper respiratory specimens during the acute phase of infection. The lowest concentration of SARS-CoV-2 viral copies this assay can detect is 138 copies/mL. A negative result does not preclude SARS-Cov-2 infection and should not be used as the sole basis for treatment or other patient management decisions. A negative result may occur with  improper specimen collection/handling, submission of specimen other than nasopharyngeal swab, presence of viral mutation(s) within the areas targeted by this assay, and inadequate number of viral copies(<138 copies/mL). A negative result must be combined with clinical observations, patient history, and epidemiological information. The expected result is Negative.  Fact Sheet for Patients:  BloggerCourse.comhttps://www.fda.gov/media/152166/download  Fact Sheet for Healthcare Providers:  SeriousBroker.ithttps://www.fda.gov/media/152162/download  This test is no t yet approved or cleared by the [place] FDA and  has been authorized for detection and/or diagnosis of SARS-CoV-2 by FDA under an Emergency Use Authorization (EUA). This EUA will remain  in effect (meaning this test can be used) for the duration of the COVID-19 declaration under Section 564(b)(1) of the Act, 21 U.S.C.section 360bbb-3(b)(1), unless the authorization is terminated  or revoked sooner.       Influenza A by PCR NEGATIVE NEGATIVE Final   Influenza B by PCR NEGATIVE NEGATIVE Final    Comment: (NOTE) The Xpert Xpress SARS-CoV-2/FLU/RSV plus assay is intended as an aid in the diagnosis of influenza from Nasopharyngeal swab specimens and should not be used as a sole basis for treatment. Nasal washings and aspirates are unacceptable for Xpert Xpress SARS-CoV-2/FLU/RSV testing.  Fact Sheet for Patients: BloggerCourse.comhttps://www.fda.gov/media/152166/download  Fact Sheet for Healthcare  Providers: SeriousBroker.ithttps://www.fda.gov/media/152162/download  This test is not yet approved or cleared by the [place] FDA and has been authorized for detection and/or diagnosis of SARS-CoV-2 by FDA under an Emergency Use Authorization (EUA). This EUA will remain in effect (meaning this test can be used) for the duration of the COVID-19 declaration under Section 564(b)(1) of the Act, 21 U.S.C. section 360bbb-3(b)(1), unless the authorization is terminated or revoked.  Performed at [facility], [address] [place], [place] [address]      Medications:    carbamazepine  200 mg Oral BID   cholecalciferol  1,000 Units Oral Daily   enalapril  20 mg Oral Daily   enoxaparin (LOVENOX) injection  40 mg Subcutaneous Q24H   insulin aspart  0-5 Units Subcutaneous QHS   insulin aspart  0-9 Units Subcutaneous TID WC   omega-3 acid  ethyl esters  1 g Oral BID   vitamin B-12  500 mcg Oral Daily   Continuous Infusions:  lactated ringers 50 mL/hr at 05/31/21 2343      LOS: 0 days   Marinda Elk  [facility]  06/01/2021, 6:59 AM

## 2021-06-01 NOTE — Progress Notes (Signed)
Physical Therapy Evaluation Patient Details Name: Christina Oconnell MRN: [id number] DOB: 1946/03/19 Today's Date: 06/01/2021   History of Present Illness  Pt is a 75 yo female, Spanish speaking s/p seizure with urinary incontinence. CT head: asymmetric hyperdensity of the right cerebellum. MRI brain negative. Elevated troponin. R shoulder no fracture. PMHx: essential HTN, basence seizure d/o.  Clinical Impression  Pt is at or close to baseline functioning and should be safe at home with available assist. There are no further acute PT needs.  Will sign off at this time.     Follow Up Recommendations No PT follow up    Equipment Recommendations  None recommended by PT    Recommendations for Other Services       Precautions / Restrictions Precautions Precautions: Fall Restrictions Weight Bearing Restrictions: No      Mobility  Bed Mobility Overal bed mobility: Needs Assistance Bed Mobility: Supine to Sit     Supine to sit: Supervision          Transfers Overall transfer level: Needs assistance   Transfers: Sit to/from Stand Sit to Stand: Supervision            Ambulation/Gait Ambulation/Gait assistance: Supervision Gait Distance (Feet): 200 Feet Assistive device: None Gait Pattern/deviations: Step-through pattern   Gait velocity interpretation: <1.8 ft/sec, indicate of risk for recurrent falls General Gait Details: slow, guarded, but steady.  Able to scan and hold conversation, but little else due to worried about lines.  Stairs Stairs: Yes Stairs assistance: Min guard Stair Management: One rail Right;Step to pattern;Forwards Number of Stairs: 2 General stair comments: safe with rail  Wheelchair Mobility    Modified Rankin (Stroke Patients Only)       Balance                                             Pertinent Vitals/Pain Pain Assessment: 0-10    Home Living Family/patient expects to be discharged to:: Private  residence Living Arrangements: Children;Spouse/significant other Available Help at Discharge: Family;Available 24 hours/day Type of Home: House Home Access: Level entry     Home Layout: One level Home Equipment: None Additional Comments: Pt plans to leave for [place] after 6/30.    Prior Function Level of Independence: Independent         Comments: Flew in from [place]     Hand Dominance   Dominant Hand: Right    Extremity/Trunk Assessment   Upper Extremity Assessment Upper Extremity Assessment: RUE deficits/detail RUE Deficits / Details: Pain    Lower Extremity Assessment Lower Extremity Assessment: Overall WFL for tasks assessed;RLE deficits/detail RLE Deficits / Details: R knee pain with movement.  Functional for gait at this time.       Communication   Communication: Prefers language other than English ([place])  Cognition Arousal/Alertness: Awake/alert Behavior During Therapy: WFL for tasks assessed/performed Overall Cognitive Status: Within Functional Limits for tasks assessed                                        General Comments      Exercises     Assessment/Plan    PT Assessment Patent does not need any further PT services  PT Problem List         PT Treatment Interventions  PT Goals (Current goals can be found in the Care Plan section)  Acute Rehab PT Goals PT Goal Formulation: All assessment and education complete, DC therapy    Frequency     Barriers to discharge        Co-evaluation               AM-PAC PT "6 Clicks" Mobility  Outcome Measure Help needed turning from your back to your side while in a flat bed without using bedrails?: A Little Help needed moving from lying on your back to sitting on the side of a flat bed without using bedrails?: A Little Help needed moving to and from a bed to a chair (including a wheelchair)?: A Little Help needed standing up from a chair using your arms (e.g.,  wheelchair or bedside chair)?: A Little Help needed to walk in hospital room?: A Little Help needed climbing 3-5 steps with a railing? : A Little 6 Click Score: 18    End of Session   Activity Tolerance: Patient tolerated treatment well Patient left: in chair;with call bell/phone within reach Nurse Communication: Mobility status PT Visit Diagnosis: Pain;Unsteadiness on feet (R26.81) Pain - Right/Left: Right Pain - part of body: Shoulder    Time: 1010-1058 PT Time Calculation (min) (ACUTE ONLY): 48 min   Charges:   PT Evaluation $PT Eval Low Complexity: 1 Low PT Treatments $Gait Training: 8-22 mins        06/01/2021  Jacinto Halim., PT Acute Rehabilitation Services [phone]  (pager) [phone]  (office)  Eliseo Gum Tavarion Babington 06/01/2021, 11:10 AM

## 2021-06-01 NOTE — Evaluation (Signed)
Occupational Therapy Evaluation Patient Details Name: Christina Oconnell MRN: [id number] DOB: 23-Apr-1946 Today's Date: 06/01/2021    History of Present Illness Pt is a 75 yo female, Spanish speaking s/p seizure with urinary incontinence. CT head: asymmetric hyperdensity of the right cerebellum. MRI brain negative. Elevated troponin. R shoulder no fracture. PMHx: essential HTN, basence seizure d/o.   Clinical Impression   Pt PTA : Pt visiting some from [place] and reports independence prior. Pt currently, limited by pain in R shoulder. Imaging is negative for fx and soft tissue swelling. Pt requires set-upA to minA overall due pain and increased time required for ADL and mobility. Pt currently using furniture in room lightly for mobility. Pt has family assist at home as needed. Education for donning.doffing and wear schedule for sling provided. PT/OT in room for use of interpreter.  Pt's RUE limited to 60* FF, but able to reach behind for pericare. Pt would benefit from continued OT skilled services for ADL and RUE ROM. OT following acutely.    Follow Up Recommendations  No OT follow up;Other (comment) (may need out-patient referral once she goes home to [place] if RUE shoulder pain does not improve. For now, pt will wear shoulder sling and encouraged to use RUE.)    Equipment Recommendations  None recommended by OT    Recommendations for Other Services       Precautions / Restrictions Precautions Precautions: Fall Restrictions Weight Bearing Restrictions: No      Mobility Bed Mobility Overal bed mobility: Needs Assistance Bed Mobility: Supine to Sit     Supine to sit: Supervision     General bed mobility comments: sitting EOB comfortably; increased time for maneuver from supine to sit EOB    Transfers Overall transfer level: Needs assistance   Transfers: Sit to/from Stand Sit to Stand: Supervision              Balance Overall balance assessment: Mild  deficits observed, not formally tested                                         ADL either performed or assessed with clinical judgement   ADL Overall ADL's : Needs assistance/impaired Eating/Feeding: Set up;Sitting   Grooming: Supervision/safety;Standing   Upper Body Bathing: Minimal assistance;Sitting   Lower Body Bathing: Minimal assistance;Sitting/lateral leans;Sit to/from stand;Cueing for safety   Upper Body Dressing : Minimal assistance;Sitting Upper Body Dressing Details (indicate cue type and reason): Education provided for donning/doffing sling with son. Pt aware of need to mobilize arm instead of using sling at all times. Lower Body Dressing: Minimal assistance;Sitting/lateral leans   Toilet Transfer: Min guard;Ambulation;Grab bars   Toileting- Clothing Manipulation and Hygiene: Min guard;Sitting/lateral lean;Sit to/from stand;Minimal assistance Toileting - Clothing Manipulation Details (indicate cue type and reason): able to perform anterior and posterior pericare after urination; would assume minA for BM     Functional mobility during ADLs: Min guard;Cueing for safety General ADL Comments: Pt limited by pain in R shoulder. Imaging is negative for fx and soft tissue swelling. Pt requires set-upA to minA overall due pain and increased time required.     Vision Baseline Vision/History: No visual deficits Patient Visual Report: No change from baseline Vision Assessment?: No apparent visual deficits     Perception     Praxis      Pertinent Vitals/Pain Pain Assessment: Faces Faces Pain Scale: Hurts little more  Pain Location: R shoulder Pain Descriptors / Indicators: Discomfort;Grimacing Pain Intervention(s): Monitored during session;Repositioned;Other (comment) (in depth conversation that shoulder sling is for comfort and pt needs to be moving RUE with normal activities of daily living. Heat/ice can help.)     Hand Dominance Right    Extremity/Trunk Assessment Upper Extremity Assessment Upper Extremity Assessment: RUE deficits/detail RUE Deficits / Details: Pain; limited ROM to 60* FF; minimal grip d/t pain RUE: Unable to fully assess due to immobilization;Unable to fully assess due to pain RUE Coordination: decreased fine motor;decreased gross motor   Lower Extremity Assessment Lower Extremity Assessment: Defer to PT evaluation;RLE deficits/detail RLE Deficits / Details: R knee pain   Cervical / Trunk Assessment Cervical / Trunk Assessment: Normal   Communication Communication Communication: Prefers language other than English (Spanish)   Cognition Arousal/Alertness: Awake/alert Behavior During Therapy: WFL for tasks assessed/performed Overall Cognitive Status: Within Functional Limits for tasks assessed                                     General Comments  used Spanish interpreter: Blossom Hoops: [id number]    Exercises     Shoulder Instructions      Home Living Family/patient expects to be discharged to:: Private residence Living Arrangements: Children;Spouse/significant other Available Help at Discharge: Family;Available 24 hours/day Type of Home: House Home Access: Level entry     Home Layout: One level     Bathroom Shower/Tub: Chief Strategy Officer: Handicapped height     Home Equipment: None   Additional Comments: Pt plans to leave for [place] after 6/30.      Prior Functioning/Environment Level of Independence: Independent        Comments: Flew in from [place]        OT Problem List: Decreased strength;Decreased activity tolerance;Impaired balance (sitting and/or standing);Decreased safety awareness;Pain      OT Treatment/Interventions: Self-care/ADL training;Therapeutic exercise;Energy conservation;DME and/or AE instruction;Therapeutic activities;Patient/family education;Balance training    OT Goals(Current goals can be found in the care plan  section) Acute Rehab OT Goals Patient Stated Goal: to go to son's home OT Goal Formulation: All assessment and education complete, DC therapy Time For Goal Achievement: 06/15/21 Potential to Achieve Goals: Good ADL Goals Pt Will Perform Toileting - Clothing Manipulation and hygiene: with supervision;sitting/lateral leans;sit to/from stand Pt/caregiver will Perform Home Exercise Program: Right Upper extremity;With Supervision;With written HEP provided  OT Frequency: Min 2X/week   Barriers to D/C:            Co-evaluation              AM-PAC OT "6 Clicks" Daily Activity     Outcome Measure Help from another person eating meals?: A Little Help from another person taking care of personal grooming?: A Little Help from another person toileting, which includes using toliet, bedpan, or urinal?: A Little Help from another person bathing (including washing, rinsing, drying)?: A Little Help from another person to put on and taking off regular upper body clothing?: A Little Help from another person to put on and taking off regular lower body clothing?: A Little 6 Click Score: 18   End of Session Equipment Utilized During Treatment: Gait belt Nurse Communication: Mobility status;Other (comment) (sling for comfort)  Activity Tolerance: Patient tolerated treatment well;Patient limited by pain Patient left: in chair;with call bell/phone within reach;with family/visitor present  OT Visit Diagnosis: Unsteadiness on feet (R26.81);Muscle weakness (generalized) (M62.81);Pain  Pain - Right/Left: Right Pain - part of body: Shoulder                Time: 1010-1058 OT Time Calculation (min): 48 min Charges:  OT General Charges $OT Visit: 1 Visit OT Evaluation $OT Eval Moderate Complexity: 1 Mod  Flora Lipps, OTR/L Acute Rehabilitation Services Pager: [phone] Office: [phone]  Flora Lipps 06/01/2021, 11:51 AM

## 2021-06-02 ENCOUNTER — Observation Stay (HOSPITAL_COMMUNITY): Payer: Self-pay

## 2021-06-02 ENCOUNTER — Other Ambulatory Visit (HOSPITAL_COMMUNITY): Payer: Self-pay

## 2021-06-02 DIAGNOSIS — R55 Syncope and collapse: Secondary | ICD-10-CM

## 2021-06-02 DIAGNOSIS — G40909 Epilepsy, unspecified, not intractable, without status epilepticus: Secondary | ICD-10-CM

## 2021-06-02 LAB — GLUCOSE, CAPILLARY
Glucose-Capillary: 101 mg/dL — ABNORMAL HIGH (ref 70–99)
Glucose-Capillary: 112 mg/dL — ABNORMAL HIGH (ref 70–99)

## 2021-06-02 LAB — HEMOGLOBIN A1C
Hgb A1c MFr Bld: 5.8 % — ABNORMAL HIGH (ref 4.8–5.6)
Mean Plasma Glucose: 120 mg/dL

## 2021-06-02 MED ORDER — CARBAMAZEPINE 200 MG PO TABS: 200.0000 mg | ORAL_TABLET | Freq: Two times a day (BID) | ORAL | 0 refills | Status: DC

## 2021-06-02 MED ORDER — CARBAMAZEPINE 200 MG PO TABS
200.0000 mg | ORAL_TABLET | Freq: Two times a day (BID) | ORAL | 0 refills | Status: AC
  Filled 2021-06-02: qty 60, 30d supply, fill #0

## 2021-06-02 NOTE — Progress Notes (Signed)
Patient and son were both educated on discharge instruction. Prescription given to patients son. Educated son on having patient taking medication daily as ordered. Instructed him to call the doctor with any changes or concerns.

## 2021-06-02 NOTE — Plan of Care (Signed)
  Problem: Education: Goal: Knowledge of General Education information will improve Description: Including pain rating scale, medication(s)/side effects and non-pharmacologic comfort measures 06/02/2021 1527 by Caleen Essex, RN Outcome: Adequate for Discharge 06/02/2021 1527 by Caleen Essex, RN Outcome: Adequate for Discharge   Problem: Health Behavior/Discharge Planning: Goal: Ability to manage health-related needs will improve 06/02/2021 1527 by Caleen Essex, RN Outcome: Adequate for Discharge 06/02/2021 1527 by Caleen Essex, RN Outcome: Adequate for Discharge   Problem: Clinical Measurements: Goal: Ability to maintain clinical measurements within normal limits will improve 06/02/2021 1527 by Caleen Essex, RN Outcome: Adequate for Discharge 06/02/2021 1527 by Caleen Essex, RN Outcome: Adequate for Discharge Goal: Will remain free from infection 06/02/2021 1527 by Caleen Essex, RN Outcome: Adequate for Discharge 06/02/2021 1527 by Caleen Essex, RN Outcome: Adequate for Discharge Goal: Diagnostic test results will improve 06/02/2021 1527 by Caleen Essex, RN Outcome: Adequate for Discharge 06/02/2021 1527 by Caleen Essex, RN Outcome: Adequate for Discharge Goal: Respiratory complications will improve 06/02/2021 1527 by Caleen Essex, RN Outcome: Adequate for Discharge 06/02/2021 1527 by Caleen Essex, RN Outcome: Adequate for Discharge   Problem: Nutrition: Goal: Adequate nutrition will be maintained 06/02/2021 1527 by Caleen Essex, RN Outcome: Adequate for Discharge 06/02/2021 1527 by Caleen Essex, RN Outcome: Adequate for Discharge   Problem: Elimination: Goal: Will not experience complications related to bowel motility 06/02/2021 1527 by Caleen Essex, RN Outcome: Adequate for Discharge 06/02/2021 1527 by Caleen Essex, RN Outcome: Adequate for Discharge Goal: Will not experience  complications related to urinary retention 06/02/2021 1527 by Caleen Essex, RN Outcome: Adequate for Discharge 06/02/2021 1527 by Caleen Essex, RN Outcome: Adequate for Discharge   Problem: Pain Managment: Goal: General experience of comfort will improve 06/02/2021 1527 by Caleen Essex, RN Outcome: Adequate for Discharge 06/02/2021 1527 by Caleen Essex, RN Outcome: Adequate for Discharge   Problem: Safety: Goal: Ability to remain free from injury will improve 06/02/2021 1527 by Caleen Essex, RN Outcome: Adequate for Discharge 06/02/2021 1527 by Caleen Essex, RN Outcome: Adequate for Discharge   Problem: Skin Integrity: Goal: Risk for impaired skin integrity will decrease 06/02/2021 1527 by Caleen Essex, RN Outcome: Adequate for Discharge 06/02/2021 1527 by Caleen Essex, RN Outcome: Adequate for Discharge   Problem: Coping: Goal: Ability to adjust to condition or change in health will improve 06/02/2021 1527 by Caleen Essex, RN Outcome: Adequate for Discharge 06/02/2021 1527 by Caleen Essex, RN Outcome: Adequate for Discharge   Problem: Health Behavior/Discharge Planning: Goal: Compliance with prescribed medication regimen will improve 06/02/2021 1527 by Caleen Essex, RN Outcome: Adequate for Discharge 06/02/2021 1527 by Caleen Essex, RN Outcome: Adequate for Discharge   Problem: Medication: Goal: Risk for medication side effects will decrease 06/02/2021 1527 by Caleen Essex, RN Outcome: Adequate for Discharge 06/02/2021 1527 by Caleen Essex, RN Outcome: Adequate for Discharge   Problem: Safety: Goal: Verbalization of understanding the information provided will improve 06/02/2021 1527 by Caleen Essex, RN Outcome: Adequate for Discharge 06/02/2021 1527 by Caleen Essex, RN Outcome: Adequate for Discharge

## 2021-06-02 NOTE — Progress Notes (Signed)
Eeg

## 2021-06-02 NOTE — Progress Notes (Addendum)
Occupational Therapy Treatment Patient Details Name: Christina Oconnell MRN: [id number] DOB: May 25, 1946 Today's Date: 06/02/2021    History of present illness 75 yo female, Spanish speaking s/p seizure with urinary incontinence. CT head: asymmetric hyperdensity of the right cerebellum. MRI brain negative. Elevated troponin. R shoulder no fracture. EGG completed 06/02/21. PMHx: essential HTN, basence seizure d/o.   OT comments  Pt progressing towards established OT goals. Providing pt with handout and education on PROM and AAROM exercises for R shoulder. Noting increased muscle tightness at R deltoid and traps. Pt performing self ROM while supine and table slides without difficulty. Pt unable to perform wall slides, so referred to dowel rod exercises instead. Son and pt both verbalized understanding. Continue to recommend dc to home and will continue to follow acutely as admitted.    Follow Up Recommendations  No OT follow up;Other (comment) (may need out-patient referral once she goes home to [place] if RUE shoulder pain does not improve. For now, pt will wear shoulder sling and encouraged to use RUE.)    Equipment Recommendations  None recommended by OT    Recommendations for Other Services      Precautions / Restrictions Precautions Precautions: Fall       Mobility Bed Mobility Overal bed mobility: Needs Assistance Bed Mobility: Supine to Sit     Supine to sit: Supervision     General bed mobility comments: supervision for safety    Transfers Overall transfer level: Needs assistance   Transfers: Sit to/from Stand Sit to Stand: Supervision         General transfer comment: Supervision for safety    Balance Overall balance assessment: Mild deficits observed, not formally tested                                         ADL either performed or assessed with clinical judgement   ADL Overall ADL's : Needs assistance/impaired                                        General ADL Comments: Focused session on RUE shoulder pain and education with HEP     Vision   Vision Assessment?: No apparent visual deficits   Perception     Praxis      Cognition Arousal/Alertness: Awake/alert Behavior During Therapy: WFL for tasks assessed/performed Overall Cognitive Status: Within Functional Limits for tasks assessed                                 General Comments: Very sweet and motivated        Exercises Exercises: General Upper Extremity;Other exercises General Exercises - Upper Extremity Shoulder Flexion: Self ROM;Right;Supine;20 reps Shoulder Extension: Self ROM;Right;Supine;20 reps Other Exercises Other Exercises: HEP handout provided including self ROM (supine or sitting) and table slides. Other Exercises: Table slides: forward flexion. 10 x. Other Exercises: Table slides: Abduction. 10 x. Other Exercises: dowl rod; forward flexion; x10. Other Exercises: reviewed wall slides for forward flexion   Shoulder Instructions       General Comments Son present. Use of video spanish interpreter Mathis Fare [phone]    Pertinent Vitals/ Pain       Pain Assessment: Faces Faces Pain Scale: Hurts little more Pain Location:  R shoulder Pain Descriptors / Indicators: Discomfort;Grimacing Pain Intervention(s): Monitored during session;Limited activity within patient's tolerance;Repositioned  Home Living                                          Prior Functioning/Environment              Frequency  Min 2X/week        Progress Toward Goals  OT Goals(current goals can now be found in the care plan section)  Progress towards OT goals: Progressing toward goals  Acute Rehab OT Goals Patient Stated Goal: to go to son's home OT Goal Formulation: All assessment and education complete, DC therapy Time For Goal Achievement: 06/15/21 Potential to Achieve Goals: Good ADL Goals Pt  Will Perform Toileting - Clothing Manipulation and hygiene: with supervision;sitting/lateral leans;sit to/from stand Pt/caregiver will Perform Home Exercise Program: Right Upper extremity;With Supervision;With written HEP provided  Plan Discharge plan remains appropriate    Co-evaluation                 AM-PAC OT "6 Clicks" Daily Activity     Outcome Measure   Help from another person eating meals?: A Little Help from another person taking care of personal grooming?: A Little Help from another person toileting, which includes using toliet, bedpan, or urinal?: A Little Help from another person bathing (including washing, rinsing, drying)?: A Little Help from another person to put on and taking off regular upper body clothing?: A Little Help from another person to put on and taking off regular lower body clothing?: A Little 6 Click Score: 18    End of Session    OT Visit Diagnosis: Unsteadiness on feet (R26.81);Muscle weakness (generalized) (M62.81);Pain Pain - Right/Left: Right Pain - part of body: Shoulder   Activity Tolerance Patient tolerated treatment well;Patient limited by pain   Patient Left in chair;with call bell/phone within reach;with family/visitor present   Nurse Communication Mobility status;Other (comment) (sling for comfort)        Time: 1607-3710 OT Time Calculation (min): 40 min  Charges: OT General Charges $OT Visit: 1 Visit OT Treatments $Therapeutic Exercise: 38-52 mins  Angellica Maddison MSOT, OTR/L Acute Rehab Pager: [phone] Office: [phone]   Theodoro Grist Brittaney Beaulieu 06/02/2021, 3:17 PM

## 2021-06-02 NOTE — Plan of Care (Signed)
  Problem: Education: Goal: Knowledge of General Education information will improve Description: Including pain rating scale, medication(s)/side effects and non-pharmacologic comfort measures Outcome: Adequate for Discharge   Problem: Health Behavior/Discharge Planning: Goal: Ability to manage health-related needs will improve Outcome: Adequate for Discharge   Problem: Clinical Measurements: Goal: Ability to maintain clinical measurements within normal limits will improve Outcome: Adequate for Discharge Goal: Will remain free from infection Outcome: Adequate for Discharge Goal: Diagnostic test results will improve Outcome: Adequate for Discharge Goal: Respiratory complications will improve Outcome: Adequate for Discharge   Problem: Nutrition: Goal: Adequate nutrition will be maintained Outcome: Adequate for Discharge   Problem: Elimination: Goal: Will not experience complications related to bowel motility Outcome: Adequate for Discharge Goal: Will not experience complications related to urinary retention Outcome: Adequate for Discharge   Problem: Pain Managment: Goal: General experience of comfort will improve Outcome: Adequate for Discharge   Problem: Safety: Goal: Ability to remain free from injury will improve Outcome: Adequate for Discharge   Problem: Skin Integrity: Goal: Risk for impaired skin integrity will decrease Outcome: Adequate for Discharge   Problem: Coping: Goal: Ability to adjust to condition or change in health will improve Outcome: Adequate for Discharge   Problem: Health Behavior/Discharge Planning: Goal: Compliance with prescribed medication regimen will improve Outcome: Adequate for Discharge   Problem: Medication: Goal: Risk for medication side effects will decrease Outcome: Adequate for Discharge   Problem: Safety: Goal: Verbalization of understanding the information provided will improve Outcome: Adequate for Discharge

## 2021-06-02 NOTE — Procedures (Signed)
Patient Name: Christina Oconnell  MRN: [id number]  Epilepsy Attending: Charlsie Quest  Referring Physician/Provider: Dr. Dow Adolph Date: 06/02/2021 Duration: 22.18 minutes  Patient history: 75 year old female with history of epilepsy who presented with syncope.  EEG evaluate for seizures.  Level of alertness: Awake, asleep  AEDs during EEG study: Carbamazepine  Technical aspects: This EEG study was done with scalp electrodes positioned according to the 10-20 International system of electrode placement. Electrical activity was acquired at a sampling rate of 500Hz  and reviewed with a high frequency filter of 70Hz  and a low frequency filter of 1Hz . EEG data were recorded continuously and digitally stored.   Description: The posterior dominant rhythm consists of 8 Hz activity of moderate voltage (25-35 uV) seen predominantly in posterior head regions, symmetric and reactive to eye opening and eye closing. Sleep was characterized by vertex waves, sleep spindles (12 to 14 Hz), maximal frontocentral region.  EEG showed intermittent 2-3Hz  delta slowing in the left temporal region. Hyperventilation and photic stimulation were not performed.     ABNORMALITY - Intermittent slow, left temporal region  IMPRESSION: This study is suggestive of nonspecific cortical dysfunction in left temporal region.  No seizures or definite epileptiform discharges were seen throughout the recording.  [name] 

## 2021-06-02 NOTE — Progress Notes (Signed)
OT Cancellation Note  Patient Details Name: Jameia Makris MRN: [id number] DOB: 06/26/1946   Cancelled Treatment:    Reason Eval/Treat Not Completed: Patient at procedure or test/ unavailable (Getting set up with EEG. Will return later as schedule allows.)  [facility] MSOT, OTR/L Acute Rehab Pager: [phone] Office: [phone] 06/02/2021, 10:24 AM

## 2021-06-02 NOTE — Discharge Summary (Signed)
Physician Discharge Summary  Christina Oconnell TKP:[id number] DOB: [birth year] DOA: 05/31/2021  PCP: Pcp, No  Admit date: 05/31/2021 Discharge date: 06/02/2021  Admitted From: Home Disposition:  Home  Recommendations for Outpatient Follow-up:  Follow up with PCP in 1-2 weeks Please obtain BMP/CBC in one week   Home Health:no Equipment/Devices:none  Discharge Condition:Stable CODE STATUS:Full Diet recommendation: Heart Healthy   Brief/Interim Summary: 75 y.o. female past medical history significant for essential hypertension absence seizure's presents with oxycodone ED after passing out at [facility] during his meal.  He relates he ran out of his carbamazepine about 4 days ago.  She has urine incontinence.  Discharge Diagnoses:  Active Problems:   Seizure disorder (HCC)   Abnormal CT of the head   Elevated troponin   Essential hypertension   Right shoulder pain  Weakness seizures/seizure disorder: She was postictal 20 minutes likely due to noncompliance with her medication as she ran out.  She is from [place] and is here visiting family. She was loaded with Tegretol and continue Tegretol had no seizure events. EEG was done that was negative.  abNormal CT of the head: CT of the head was unremarkable MRI of the brain was unremarkable.  Elevated troponin: Likely demand ischemia in the setting of seizures twelve-lead EKG showed no signs of ischemia.  Essential hypertension: No change made to her medication.  Right shoulder x-ray: Was negative for fracture dislocation were a sling for 3 weeks continue exercise recommended by physical therapy. Discharge Instructions  Discharge Instructions     Diet - low sodium heart healthy   Complete by: As directed    Increase activity slowly   Complete by: As directed       Allergies as of 06/02/2021   No Known Allergies      Medication List     TAKE these medications    acetaminophen 500 MG tablet Commonly known  as: TYLENOL Take 2 tablets (1,000 mg total) by mouth every 6 (six) hours as needed for moderate pain.   carbamazepine 200 MG tablet Commonly known as: TEGRETOL Take 200 mg by mouth in the morning and at bedtime.   cholecalciferol 25 MCG (1000 UNIT) tablet Commonly known as: VITAMIN D3 Take 1,000 Units by mouth daily.   enalapril 20 MG tablet Commonly known as: VASOTEC Take 20 mg by mouth daily.   omega-3 acid ethyl esters 1 g capsule Commonly known as: LOVAZA Take 1 g by mouth 2 (two) times daily.   VITAMIN B 12 PO Take 1 tablet by mouth daily.        No Known Allergies  Consultations: None   Procedures/Studies: DG Shoulder Right  Result Date: 05/31/2021 CLINICAL DATA:  Right shoulder pain after seizure EXAM: RIGHT SHOULDER - 2+ VIEW COMPARISON:  None. FINDINGS: There is no evidence of fracture or dislocation. There is no evidence of arthropathy or other focal bone abnormality. Soft tissues are unremarkable. IMPRESSION: Negative. Electronically Signed   By: Duanne Guess D.O.   On: 05/31/2021 16:29   CT Head Wo Contrast  Result Date: 05/31/2021 CLINICAL DATA:  Syncope.  Seizure. EXAM: CT HEAD WITHOUT CONTRAST TECHNIQUE: Contiguous axial images were obtained from the base of the skull through the vertex without intravenous contrast. COMPARISON:  None. FINDINGS: Brain: Asymmetric hypodensity left cerebellum. Ventricle size normal.  Negative for hemorrhage. Vascular: Negative for hyperdense vessel Skull: Negative Sinuses/Orbits: Mild mucosal edema paranasal sinuses. Negative orbit Other: None IMPRESSION: Asymmetric hypodensity left cerebellum. While this could represent artifact, infarct and  tumor possible. Recommend MRI brain without and with contrast for further evaluation. Electronically Signed   By: Marlan Palau M.D.   On: 05/31/2021 16:38   MR Brain W and Wo Contrast  Result Date: 05/31/2021 CLINICAL DATA:  Seizure EXAM: MRI HEAD WITHOUT AND WITH CONTRAST TECHNIQUE:  Multiplanar, multiecho pulse sequences of the brain and surrounding structures were obtained without and with intravenous contrast. CONTRAST:  6.71mL GADAVIST GADOBUTROL 1 MMOL/ML IV SOLN COMPARISON:  None. FINDINGS: Brain: No acute infarct, mass effect or extra-axial collection. No acute or chronic hemorrhage. Normal white matter signal, parenchymal volume and CSF spaces. The midline structures are normal. The hippocampi are normal and symmetric in size and signal. The hypothalamus and mamillary bodies are normal. There is no cortical ectopia or dysplasia. No abnormal contrast enhancement. Vascular: Major flow voids are preserved. Skull and upper cervical spine: Normal calvarium and skull base. Visualized upper cervical spine and soft tissues are normal. Sinuses/Orbits:No paranasal sinus fluid levels or advanced mucosal thickening. No mastoid or middle ear effusion. Normal orbits. IMPRESSION: Normal brain MRI. Electronically Signed   By: Deatra Robinson M.D.   On: 05/31/2021 20:36   DG Chest Port 1 View  Result Date: 05/31/2021 CLINICAL DATA:  Syncope.  Seizure EXAM: PORTABLE CHEST 1 VIEW COMPARISON:  None. FINDINGS: The heart size and mediastinal contours are within normal limits. Both lungs are clear. The visualized skeletal structures are unremarkable. IMPRESSION: No active disease. Electronically Signed   By: Marlan Palau M.D.   On: 05/31/2021 16:26   (Echo, Carotid, EGD, Colonoscopy, ERCP)    Subjective: No complaints  Discharge Exam: Vitals:   06/02/21 0338 06/02/21 0817  BP: (!) 121/58 (!) 129/100  Pulse:  75  Resp:  19  Temp: 97.8 F (36.6 C) 98.9 F (37.2 C)  SpO2:  96%   Vitals:   06/01/21 1945 06/01/21 2349 06/02/21 0338 06/02/21 0817  BP: (!) 147/77  (!) 121/58 (!) 129/100  Pulse:    75  Resp: 20   19  Temp: 98 F (36.7 C) 98.4 F (36.9 C) 97.8 F (36.6 C) 98.9 F (37.2 C)  TempSrc: Oral Oral Oral Oral  SpO2:    96%  Weight:        General: Pt is alert, awake, not in  acute distress Cardiovascular: RRR, S1/S2 +, no rubs, no gallops Respiratory: CTA bilaterally, no wheezing, no rhonchi Abdominal: Soft, NT, ND, bowel sounds + Extremities: no edema, no cyanosis    The results of significant diagnostics from this hospitalization (including imaging, microbiology, ancillary and laboratory) are listed below for reference.     Microbiology: Recent Results (from the past 240 hour(s))  Resp Panel by RT-PCR (Flu A&B, Covid) Nasopharyngeal Swab     Status: None   Collection Time: 05/31/21  5:56 PM   Specimen: Nasopharyngeal Swab; Nasopharyngeal(NP) swabs in vial transport medium  Result Value Ref Range Status   SARS Coronavirus 2 by RT PCR NEGATIVE NEGATIVE Final    Comment: (NOTE) SARS-CoV-2 target nucleic acids are NOT DETECTED.  The SARS-CoV-2 RNA is generally detectable in upper respiratory specimens during the acute phase of infection. The lowest concentration of SARS-CoV-2 viral copies this assay can detect is 138 copies/mL. A negative result does not preclude SARS-Cov-2 infection and should not be used as the sole basis for treatment or other patient management decisions. A negative result may occur with  improper specimen collection/handling, submission of specimen other than nasopharyngeal swab, presence of viral mutation(s) within the areas targeted  by this assay, and inadequate number of viral copies(<138 copies/mL). A negative result must be combined with clinical observations, patient history, and epidemiological information. The expected result is Negative.  Fact Sheet for Patients:  BloggerCourse.com  Fact Sheet for Healthcare Providers:  SeriousBroker.it  This test is no t yet approved or cleared by the [place] FDA and  has been authorized for detection and/or diagnosis of SARS-CoV-2 by FDA under an Emergency Use Authorization (EUA). This EUA will remain  in effect (meaning this  test can be used) for the duration of the COVID-19 declaration under Section 564(b)(1) of the Act, 21 U.S.C.section 360bbb-3(b)(1), unless the authorization is terminated  or revoked sooner.       Influenza A by PCR NEGATIVE NEGATIVE Final   Influenza B by PCR NEGATIVE NEGATIVE Final    Comment: (NOTE) The Xpert Xpress SARS-CoV-2/FLU/RSV plus assay is intended as an aid in the diagnosis of influenza from Nasopharyngeal swab specimens and should not be used as a sole basis for treatment. Nasal washings and aspirates are unacceptable for Xpert Xpress SARS-CoV-2/FLU/RSV testing.  Fact Sheet for Patients: BloggerCourse.com  Fact Sheet for Healthcare Providers: SeriousBroker.it  This test is not yet approved or cleared by the [place] FDA and has been authorized for detection and/or diagnosis of SARS-CoV-2 by FDA under an Emergency Use Authorization (EUA). This EUA will remain in effect (meaning this test can be used) for the duration of the COVID-19 declaration under Section 564(b)(1) of the Act, 21 U.S.C. section 360bbb-3(b)(1), unless the authorization is terminated or revoked.  Performed at [facility], [address] [place], [place] [address]      Labs: BNP (last 3 results) No results for input(s): BNP in the last 8760 hours. Basic Metabolic Panel: Recent Labs  Lab 05/31/21 1542 06/01/21 0215  NA 135 138  K 4.0 3.5  CL 102 105  CO2 23 28  GLUCOSE 140* 153*  BUN 17 15  CREATININE 0.71 0.67  CALCIUM 8.8* 8.9  MG  --  2.0  PHOS  --  3.3   Liver Function Tests: Recent Labs  Lab 05/31/21 1542  AST 29  ALT 20  ALKPHOS 82  BILITOT 0.5  PROT 6.8  ALBUMIN 3.7   No results for input(s): LIPASE, AMYLASE in the last 168 hours. No results for input(s): AMMONIA in the last 168 hours. CBC: Recent Labs  Lab 05/31/21 1542 06/01/21 0215  WBC 8.3 10.1  NEUTROABS 5.3  --   HGB 12.0 10.8*  HCT 37.8  33.8*  MCV 84.8 84.1  PLT 311 291   Cardiac Enzymes: No results for input(s): CKTOTAL, CKMB, CKMBINDEX, TROPONINI in the last 168 hours. BNP: Invalid input(s): POCBNP CBG: Recent Labs  Lab 05/31/21 1522 06/01/21 0636 06/01/21 1558 06/01/21 2123 06/02/21 0619  GLUCAP 117* 100* 136* 96 101*   D-Dimer No results for input(s): DDIMER in the last 72 hours. Hgb A1c No results for input(s): HGBA1C in the last 72 hours. Lipid Profile No results for input(s): CHOL, HDL, LDLCALC, TRIG, CHOLHDL, LDLDIRECT in the last 72 hours. Thyroid function studies No results for input(s): TSH, T4TOTAL, T3FREE, THYROIDAB in the last 72 hours.  Invalid input(s): FREET3 Anemia work up No results for input(s): VITAMINB12, FOLATE, FERRITIN, TIBC, IRON, RETICCTPCT in the last 72 hours. Urinalysis    Component Value Date/Time   COLORURINE STRAW (A) 05/31/2021 1951   APPEARANCEUR CLEAR 05/31/2021 1951   LABSPEC 1.008 05/31/2021 1951   PHURINE 7.0 05/31/2021 1951  GLUCOSEU NEGATIVE 05/31/2021 1951   HGBUR SMALL (A) 05/31/2021 1951   BILIRUBINUR NEGATIVE 05/31/2021 1951   KETONESUR NEGATIVE 05/31/2021 1951   PROTEINUR NEGATIVE 05/31/2021 1951   NITRITE NEGATIVE 05/31/2021 1951   LEUKOCYTESUR NEGATIVE 05/31/2021 1951   Sepsis Labs Invalid input(s): PROCALCITONIN,  WBC,  LACTICIDVEN Microbiology Recent Results (from the past 240 hour(s))  Resp Panel by RT-PCR (Flu A&B, Covid) Nasopharyngeal Swab     Status: None   Collection Time: 05/31/21  5:56 PM   Specimen: Nasopharyngeal Swab; Nasopharyngeal(NP) swabs in vial transport medium  Result Value Ref Range Status   SARS Coronavirus 2 by RT PCR NEGATIVE NEGATIVE Final    Comment: (NOTE) SARS-CoV-2 target nucleic acids are NOT DETECTED.  The SARS-CoV-2 RNA is generally detectable in upper respiratory specimens during the acute phase of infection. The lowest concentration of SARS-CoV-2 viral copies this assay can detect is 138 copies/mL. A  negative result does not preclude SARS-Cov-2 infection and should not be used as the sole basis for treatment or other patient management decisions. A negative result may occur with  improper specimen collection/handling, submission of specimen other than nasopharyngeal swab, presence of viral mutation(s) within the areas targeted by this assay, and inadequate number of viral copies(<138 copies/mL). A negative result must be combined with clinical observations, patient history, and epidemiological information. The expected result is Negative.  Fact Sheet for Patients:  BloggerCourse.comhttps://www.fda.gov/media/152166/download  Fact Sheet for Healthcare Providers:  SeriousBroker.ithttps://www.fda.gov/media/152162/download  This test is no t yet approved or cleared by the [place] FDA and  has been authorized for detection and/or diagnosis of SARS-CoV-2 by FDA under an Emergency Use Authorization (EUA). This EUA will remain  in effect (meaning this test can be used) for the duration of the COVID-19 declaration under Section 564(b)(1) of the Act, 21 U.S.C.section 360bbb-3(b)(1), unless the authorization is terminated  or revoked sooner.       Influenza A by PCR NEGATIVE NEGATIVE Final   Influenza B by PCR NEGATIVE NEGATIVE Final    Comment: (NOTE) The Xpert Xpress SARS-CoV-2/FLU/RSV plus assay is intended as an aid in the diagnosis of influenza from Nasopharyngeal swab specimens and should not be used as a sole basis for treatment. Nasal washings and aspirates are unacceptable for Xpert Xpress SARS-CoV-2/FLU/RSV testing.  Fact Sheet for Patients: BloggerCourse.comhttps://www.fda.gov/media/152166/download  Fact Sheet for Healthcare Providers: SeriousBroker.ithttps://www.fda.gov/media/152162/download  This test is not yet approved or cleared by the [place] FDA and has been authorized for detection and/or diagnosis of SARS-CoV-2 by FDA under an Emergency Use Authorization (EUA). This EUA will remain in effect (meaning this test can  be used) for the duration of the COVID-19 declaration under Section 564(b)(1) of the Act, 21 U.S.C. section 360bbb-3(b)(1), unless the authorization is terminated or revoked.  Performed at [facility], [address] [place], [place] [address]      Time coordinating discharge: Over 30 minutes  SIGNED:   Marinda ElkAbraham Feliz Ortiz, MD  [facility] 06/02/2021, 9:05 AM Pager   If 7PM-7AM, please contact night-coverage [email or web address] Password [email or web address]

## 2021-06-02 NOTE — TOC Transition Note (Signed)
Transition of Care [facility]) - CM/SW Discharge Note   Patient Details  Name: Christina Oconnell MRN: [id number] Date of Birth: [birth year]  [facility] CM/SW Contact:  Pollie Friar, RN Phone Number: 06/02/2021, 2:55 PM   Clinical Narrative:    PT met and spoke to pt and son using interpretor services.  Patient is in [place] from [place] visiting her son. She plans to return to [place] at the end of the month and will f/u with her PCP. CM has provided her the release of information for and faxed it to medical records to have her hospital information sent to her PCP. CM has also updated the son about using My Chart to her her hospital information.  No f/u per PT/OT and no DME needs. Medication for home sent to [facility] and will be delivered to the room. Son to provide transport home.   Final next level of care: Home/Self Care Barriers to Discharge: Inadequate or no insurance, Barriers Unresolved (comment)   Patient Goals and CMS Choice        Discharge Placement                       Discharge Plan and Services                                     Social Determinants of Health (SDOH) Interventions     Readmission Risk Interventions No flowsheet data found.

## 2021-06-03 LAB — CARBAMAZEPINE, FREE AND TOTAL
Carbamazepine, Free: 0.7 ug/mL (ref 0.6–4.2)
Carbamazepine, Total: 3.9 ug/mL — ABNORMAL LOW (ref 4.0–12.0)

## 2022-10-28 IMAGING — MR MR HEAD WO/W CM
14 of 16 series · 40 of 48 positions shown · IV contrast (gadavist)
Comparison: None.

CLINICAL DATA: Seizure

EXAM:
MRI HEAD WITHOUT AND WITH CONTRAST
TECHNIQUE: Multiplanar, multiecho pulse sequences of the brain and surrounding
structures were obtained without and with intravenous contrast.
CONTRAST:  6.5mL GADAVIST GADOBUTROL 1 MMOL/ML IV SOLN

[Series 5: DWI · axial · 3.0mm · 0.88mm/px · z∈[-112,+34]mm · 6 of 100 slices shown (1 of 4)]
[im 1/100]
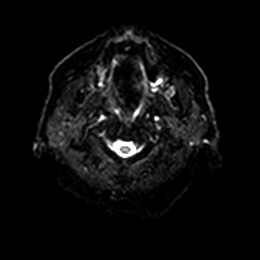
[im 20/100]
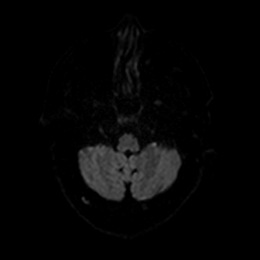
[im 40/100]
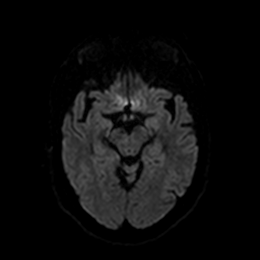
[im 60/100]
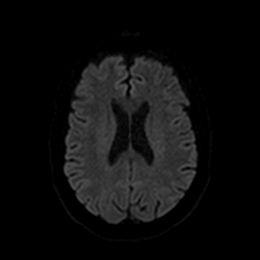
[im 80/100]
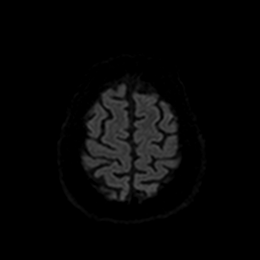
[im 100/100]
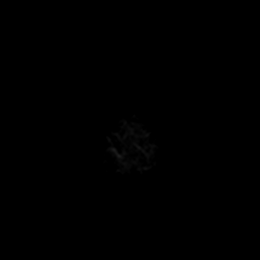

[Series 6: DWI · axial · 3.0mm · 0.88mm/px · z∈[-112,+34]mm · 2 of 50 slices shown (2 of 4)]
[im 1/50]
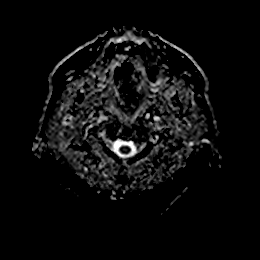
[im 50/50]
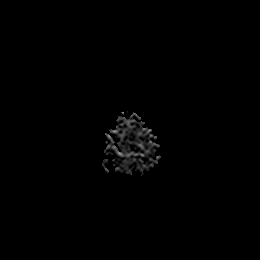

[Series 7: DWI · coronal · 4.0mm · 0.88mm/px · 5 of 72 slices shown (3 of 4)]
[im 1/72]
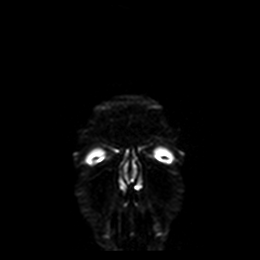
[im 18/72]
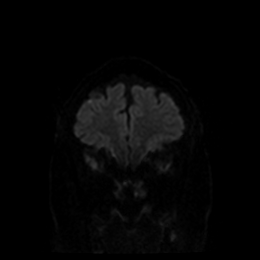
[im 36/72]
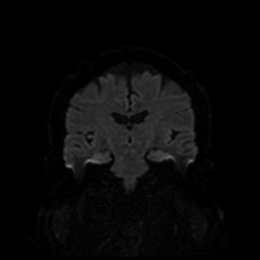
[im 54/72]
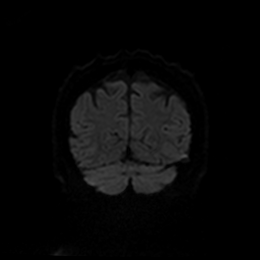
[im 72/72]
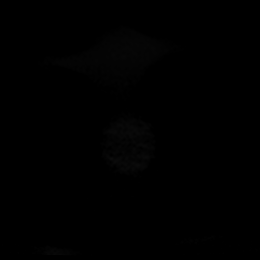

[Series 8: DWI · coronal · 4.0mm · 0.88mm/px · 3 of 36 slices shown (4 of 4)]
[im 1/36]
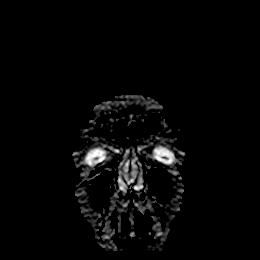
[im 18/36]
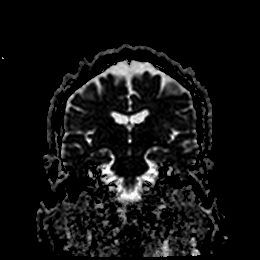
[im 36/36]
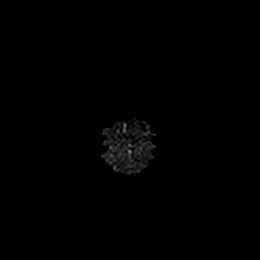

[Series 9: T1 · sagittal · 5.0mm · 0.75mm/px · 2 of 25 slices shown]
[im 1/25]
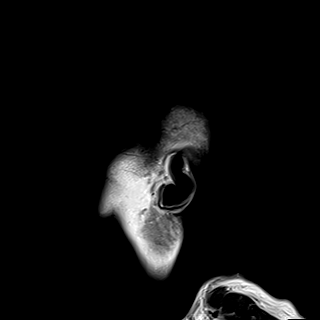
[im 25/25]
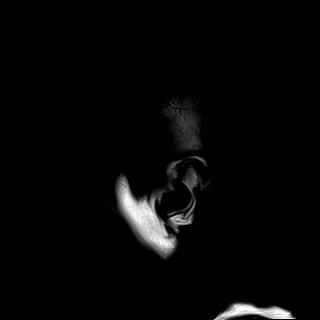

[Series 10: FLAIR · axial · 5.0mm · 0.45mm/px · z∈[-109,+34]mm · 2 of 25 slices shown (1 of 2)]
[im 1/25]
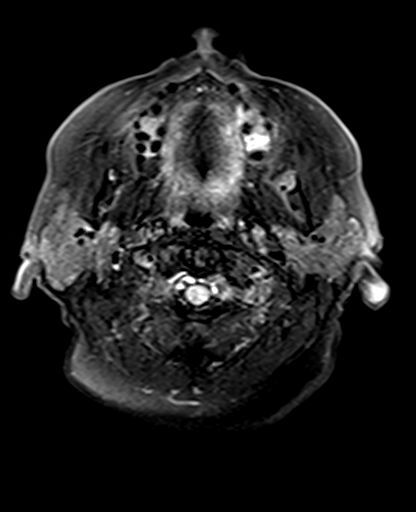
[im 25/25]
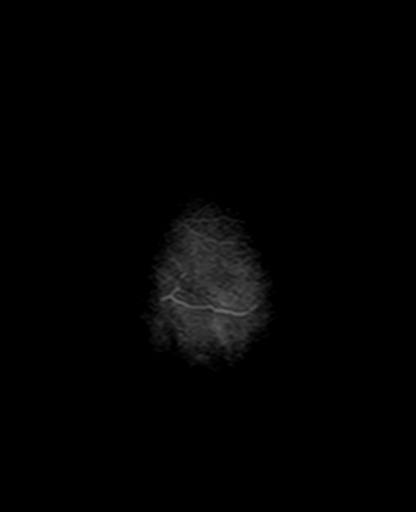

[Series 12: pha_images · axial · 3.0mm · 0.90mm/px · z∈[-113,+39]mm · 4 of 52 slices shown]
[im 1/52]
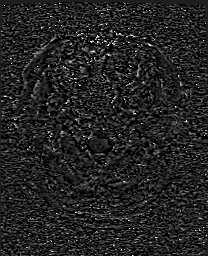
[im 18/52]
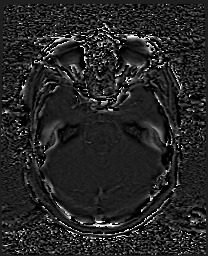
[im 35/52]
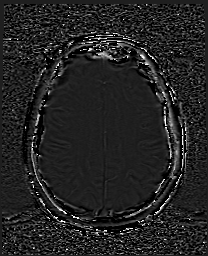
[im 52/52]
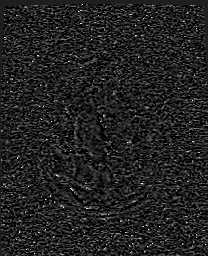

[Series 13: swi_images · axial · 3.0mm · 0.90mm/px · z∈[-113,+39]mm · 4 of 52 slices shown]
[im 1/52]
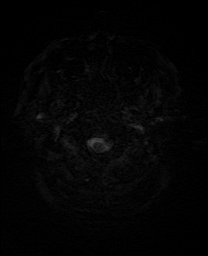
[im 18/52]
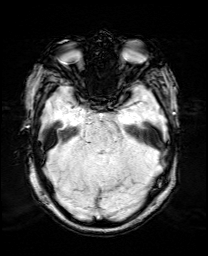
[im 35/52]
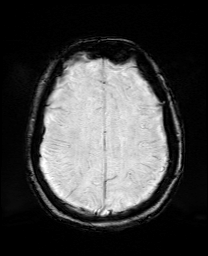
[im 52/52]
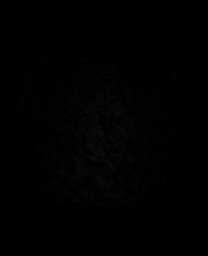

[Series 15: T2 · axial · 5.0mm · 0.72mm/px · z∈[-110,+33]mm · 2 of 25 slices shown (1 of 2)]
[im 1/25]
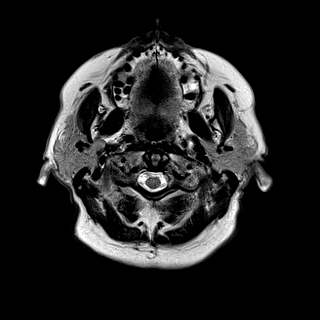
[im 25/25]
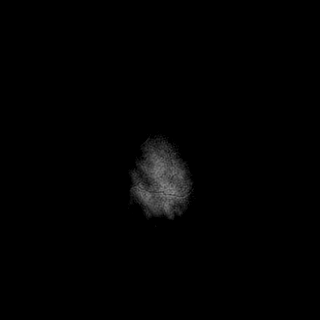

[Series 17: T2 · coronal · 3.0mm · 0.27mm/px · 2 of 32 slices shown (2 of 2)]
[im 1/32]
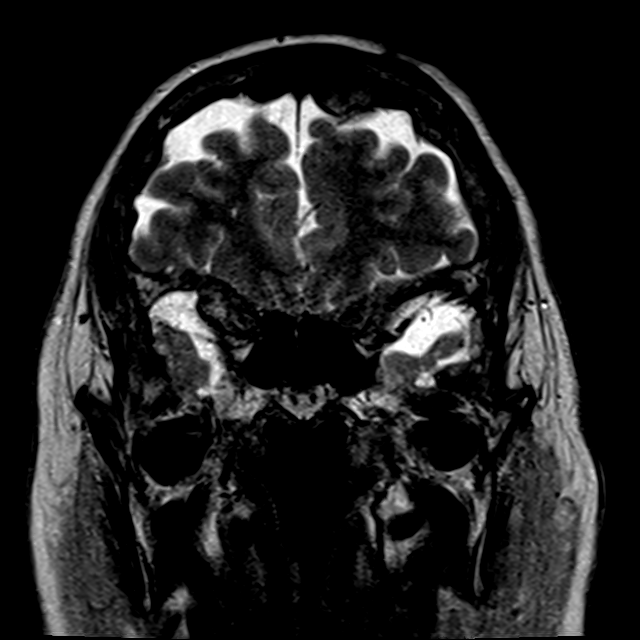
[im 32/32]
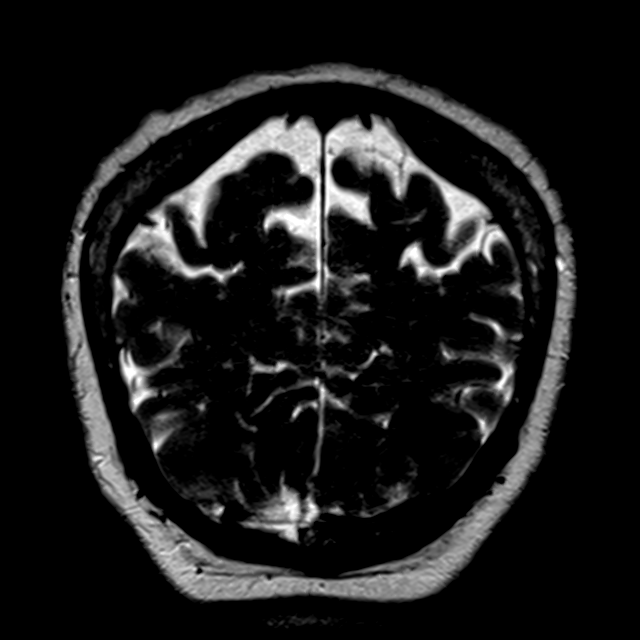

[Series 18: FLAIR · coronal · 3.0mm · 0.56mm/px · 2 of 32 slices shown (2 of 2)]
[im 1/32]
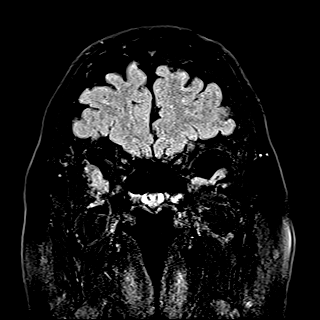
[im 32/32]
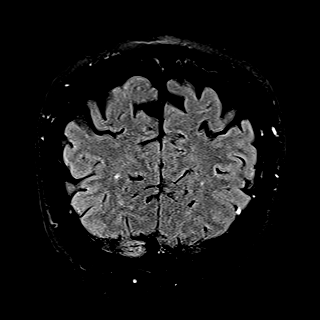

[Series 19: T2 post-contrast · coronal · 5.0mm · 0.72mm/px · 2 of 30 slices shown]
[im 1/30]
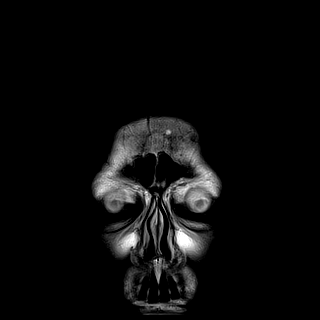
[im 30/30]
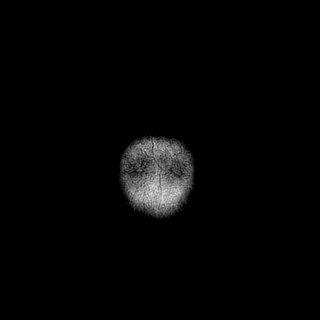

[Series 21: T1 post-contrast · coronal · 5.0mm · 0.34mm/px · 2 of 30 slices shown (1 of 2)]
[im 1/30]
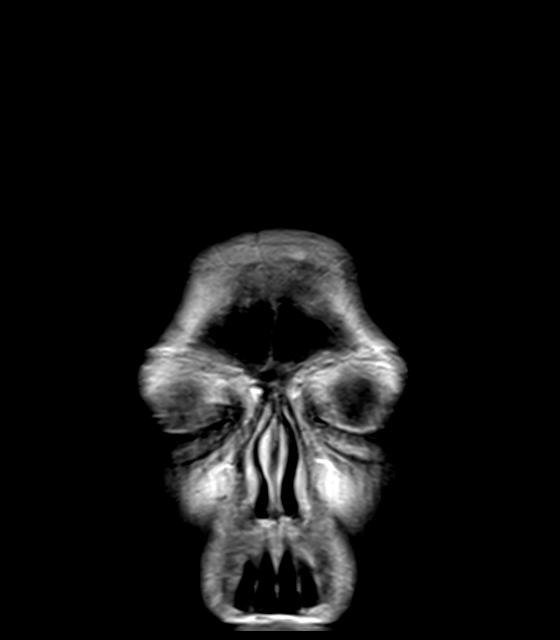
[im 30/30]
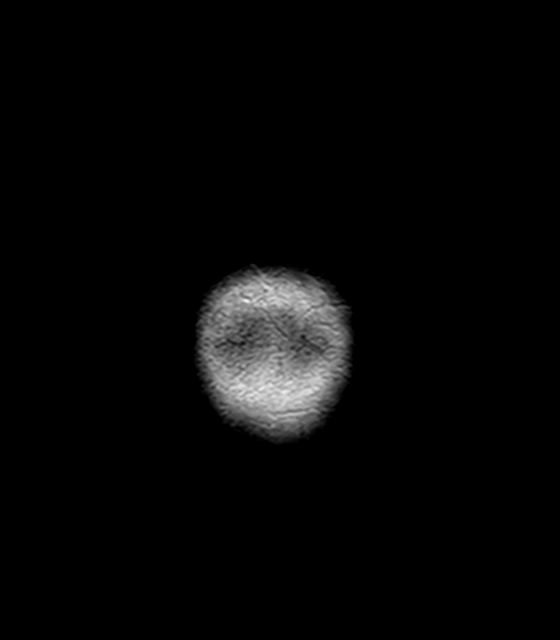

[Series 22: T1 post-contrast · sagittal · 5.0mm · 0.75mm/px · 2 of 25 slices shown (2 of 2)]
[im 1/25]
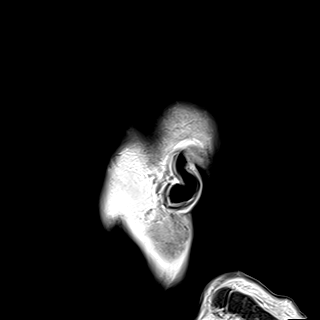
[im 25/25]
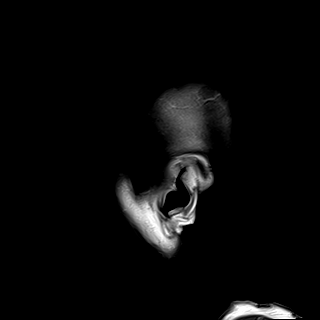

[40 of 48 positions shown; findings below may reference images not displayed]

FINDINGS: Brain: No acute infarct, mass effect or extra-axial collection. No
acute or chronic hemorrhage. Normal white matter signal, parenchymal
volume and CSF spaces. The midline structures are normal. The
hippocampi are normal and symmetric in size and signal. The
hypothalamus and mamillary bodies are normal. There is no cortical
ectopia or dysplasia. No abnormal contrast enhancement.

Vascular: Major flow voids are preserved.

Skull and upper cervical spine: Normal calvarium and skull base.
Visualized upper cervical spine and soft tissues are normal.

Sinuses/Orbits:No paranasal sinus fluid levels or advanced mucosal
thickening. No mastoid or middle ear effusion. Normal orbits.
IMPRESSION: Normal brain MRI.
